# Patient Record
Sex: Female | Born: 1970 | Race: White | Hispanic: No | Marital: Married | Smoking: Never smoker
Health system: Southern US, Community
[De-identification: ages and names within clinical notes are randomized; demographics above are authoritative.]

## PROBLEM LIST (undated history)

## (undated) HISTORY — PX: VAGINAL HYSTERECTOMY: SUR661

---

## 2018-02-15 DIAGNOSIS — R946 Abnormal results of thyroid function studies: Secondary | ICD-10-CM | POA: Diagnosis not present

## 2018-02-15 DIAGNOSIS — R634 Abnormal weight loss: Secondary | ICD-10-CM | POA: Diagnosis not present

## 2018-02-15 DIAGNOSIS — E042 Nontoxic multinodular goiter: Secondary | ICD-10-CM | POA: Diagnosis not present

## 2018-02-19 DIAGNOSIS — R946 Abnormal results of thyroid function studies: Secondary | ICD-10-CM | POA: Diagnosis not present

## 2018-02-19 DIAGNOSIS — E042 Nontoxic multinodular goiter: Secondary | ICD-10-CM | POA: Diagnosis not present

## 2018-08-15 DIAGNOSIS — E042 Nontoxic multinodular goiter: Secondary | ICD-10-CM | POA: Diagnosis not present

## 2018-09-04 DIAGNOSIS — Z8249 Family history of ischemic heart disease and other diseases of the circulatory system: Secondary | ICD-10-CM | POA: Diagnosis not present

## 2018-09-04 DIAGNOSIS — Z1331 Encounter for screening for depression: Secondary | ICD-10-CM | POA: Diagnosis not present

## 2018-09-04 DIAGNOSIS — Z0000 Encounter for general adult medical examination without abnormal findings: Secondary | ICD-10-CM | POA: Diagnosis not present

## 2018-09-04 DIAGNOSIS — Z833 Family history of diabetes mellitus: Secondary | ICD-10-CM | POA: Diagnosis not present

## 2018-09-09 DIAGNOSIS — N83201 Unspecified ovarian cyst, right side: Secondary | ICD-10-CM | POA: Diagnosis not present

## 2018-09-09 DIAGNOSIS — R110 Nausea: Secondary | ICD-10-CM | POA: Diagnosis not present

## 2018-09-09 DIAGNOSIS — R109 Unspecified abdominal pain: Secondary | ICD-10-CM | POA: Diagnosis not present

## 2018-09-09 DIAGNOSIS — R319 Hematuria, unspecified: Secondary | ICD-10-CM | POA: Diagnosis not present

## 2018-09-10 DIAGNOSIS — R109 Unspecified abdominal pain: Secondary | ICD-10-CM | POA: Diagnosis not present

## 2019-09-19 DIAGNOSIS — Z20828 Contact with and (suspected) exposure to other viral communicable diseases: Secondary | ICD-10-CM | POA: Diagnosis not present

## 2019-09-22 DIAGNOSIS — U071 COVID-19: Secondary | ICD-10-CM | POA: Diagnosis not present

## 2019-09-22 DIAGNOSIS — R509 Fever, unspecified: Secondary | ICD-10-CM | POA: Diagnosis not present

## 2019-09-22 DIAGNOSIS — R519 Headache, unspecified: Secondary | ICD-10-CM | POA: Diagnosis not present

## 2019-09-26 DIAGNOSIS — R079 Chest pain, unspecified: Secondary | ICD-10-CM | POA: Diagnosis not present

## 2019-09-26 DIAGNOSIS — J1282 Pneumonia due to coronavirus disease 2019: Secondary | ICD-10-CM | POA: Diagnosis not present

## 2019-09-26 DIAGNOSIS — U071 COVID-19: Secondary | ICD-10-CM | POA: Diagnosis not present

## 2019-09-26 DIAGNOSIS — R918 Other nonspecific abnormal finding of lung field: Secondary | ICD-10-CM | POA: Diagnosis not present

## 2019-09-26 DIAGNOSIS — R0602 Shortness of breath: Secondary | ICD-10-CM | POA: Diagnosis not present

## 2019-09-26 DIAGNOSIS — R110 Nausea: Secondary | ICD-10-CM | POA: Diagnosis not present

## 2019-09-28 ENCOUNTER — Other Ambulatory Visit: Payer: Self-pay

## 2019-09-28 ENCOUNTER — Encounter (HOSPITAL_COMMUNITY): Payer: Self-pay | Admitting: *Deleted

## 2019-09-28 ENCOUNTER — Emergency Department (HOSPITAL_COMMUNITY)
Admission: EM | Admit: 2019-09-28 | Discharge: 2019-09-28 | Disposition: A | Discharge status: Home / Self Care | Payer: BC Managed Care – PPO | Source: Home / Self Care

## 2019-09-28 ENCOUNTER — Emergency Department (HOSPITAL_COMMUNITY): Payer: BC Managed Care – PPO

## 2019-09-28 ENCOUNTER — Inpatient Hospital Stay (HOSPITAL_COMMUNITY)
Admission: EM | Admit: 2019-09-28 | Discharge: 2019-10-05 | DRG: 177 | Disposition: A | Discharge status: Home / Self Care | Payer: BC Managed Care – PPO | Attending: Internal Medicine | Admitting: Internal Medicine

## 2019-09-28 DIAGNOSIS — R0689 Other abnormalities of breathing: Secondary | ICD-10-CM | POA: Diagnosis not present

## 2019-09-28 DIAGNOSIS — J9601 Acute respiratory failure with hypoxia: Secondary | ICD-10-CM | POA: Diagnosis not present

## 2019-09-28 DIAGNOSIS — Z833 Family history of diabetes mellitus: Secondary | ICD-10-CM | POA: Diagnosis not present

## 2019-09-28 DIAGNOSIS — J158 Pneumonia due to other specified bacteria: Secondary | ICD-10-CM | POA: Diagnosis not present

## 2019-09-28 DIAGNOSIS — E876 Hypokalemia: Secondary | ICD-10-CM | POA: Diagnosis not present

## 2019-09-28 DIAGNOSIS — Z8249 Family history of ischemic heart disease and other diseases of the circulatory system: Secondary | ICD-10-CM | POA: Diagnosis not present

## 2019-09-28 DIAGNOSIS — Z880 Allergy status to penicillin: Secondary | ICD-10-CM

## 2019-09-28 DIAGNOSIS — R509 Fever, unspecified: Secondary | ICD-10-CM | POA: Insufficient documentation

## 2019-09-28 DIAGNOSIS — R110 Nausea: Secondary | ICD-10-CM | POA: Insufficient documentation

## 2019-09-28 DIAGNOSIS — J1282 Pneumonia due to coronavirus disease 2019: Secondary | ICD-10-CM | POA: Diagnosis not present

## 2019-09-28 DIAGNOSIS — R9431 Abnormal electrocardiogram [ECG] [EKG]: Secondary | ICD-10-CM | POA: Diagnosis not present

## 2019-09-28 DIAGNOSIS — U071 COVID-19: Secondary | ICD-10-CM | POA: Diagnosis not present

## 2019-09-28 DIAGNOSIS — M791 Myalgia, unspecified site: Secondary | ICD-10-CM | POA: Insufficient documentation

## 2019-09-28 DIAGNOSIS — K5900 Constipation, unspecified: Secondary | ICD-10-CM | POA: Diagnosis not present

## 2019-09-28 DIAGNOSIS — R0902 Hypoxemia: Secondary | ICD-10-CM | POA: Diagnosis not present

## 2019-09-28 DIAGNOSIS — J189 Pneumonia, unspecified organism: Secondary | ICD-10-CM

## 2019-09-28 DIAGNOSIS — R079 Chest pain, unspecified: Secondary | ICD-10-CM | POA: Diagnosis not present

## 2019-09-28 DIAGNOSIS — Z5321 Procedure and treatment not carried out due to patient leaving prior to being seen by health care provider: Secondary | ICD-10-CM | POA: Insufficient documentation

## 2019-09-28 LAB — CBC WITH DIFFERENTIAL/PLATELET
Abs Immature Granulocytes: 0.03 10*3/uL (ref 0.00–0.07)
Basophils Absolute: 0 10*3/uL (ref 0.0–0.1)
Basophils Relative: 0 %
Eosinophils Absolute: 0 10*3/uL (ref 0.0–0.5)
Eosinophils Relative: 0 %
HCT: 40.2 % (ref 36.0–46.0)
Hemoglobin: 13.6 g/dL (ref 12.0–15.0)
Immature Granulocytes: 1 %
Lymphocytes Relative: 8 %
Lymphs Abs: 0.5 10*3/uL — ABNORMAL LOW (ref 0.7–4.0)
MCH: 29.7 pg (ref 26.0–34.0)
MCHC: 33.8 g/dL (ref 30.0–36.0)
MCV: 87.8 fL (ref 80.0–100.0)
Monocytes Absolute: 0.3 10*3/uL (ref 0.1–1.0)
Monocytes Relative: 4 %
Neutro Abs: 5.5 10*3/uL (ref 1.7–7.7)
Neutrophils Relative %: 87 %
Platelets: 208 10*3/uL (ref 150–400)
RBC: 4.58 MIL/uL (ref 3.87–5.11)
RDW: 12.1 % (ref 11.5–15.5)
WBC: 6.3 10*3/uL (ref 4.0–10.5)
nRBC: 0 % (ref 0.0–0.2)

## 2019-09-28 LAB — BASIC METABOLIC PANEL
Anion gap: 12 (ref 5–15)
BUN: 8 mg/dL (ref 6–20)
CO2: 28 mmol/L (ref 22–32)
Calcium: 8.7 mg/dL — ABNORMAL LOW (ref 8.9–10.3)
Chloride: 99 mmol/L (ref 98–111)
Creatinine, Ser: 0.8 mg/dL (ref 0.44–1.00)
GFR calc Af Amer: >60 mL/min (ref >60)
GFR calc non Af Amer: >60 mL/min (ref >60)
Glucose, Bld: 107 mg/dL — ABNORMAL HIGH (ref 70–99)
Potassium: 3 mmol/L — ABNORMAL LOW (ref 3.5–5.1)
Sodium: 139 mmol/L (ref 135–145)

## 2019-09-28 LAB — I-STAT BETA HCG BLOOD, ED (MC, WL, AP ONLY): I-stat hCG, quantitative: <5 m[IU]/mL (ref <5)

## 2019-09-28 LAB — TROPONIN I (HIGH SENSITIVITY)
Troponin I (High Sensitivity): 8 ng/L (ref <18)
Troponin I (High Sensitivity): 8 ng/L (ref <18)

## 2019-09-28 MED ORDER — ONDANSETRON 4 MG PO TBDP
4.0000 mg | ORAL_TABLET | Freq: Once | ORAL | Status: AC | PRN
  Administered 2019-09-28: 4 mg via ORAL
  Filled 2019-09-28: qty 1

## 2019-09-28 MED ORDER — ACETAMINOPHEN 325 MG PO TABS
650.0000 mg | ORAL_TABLET | Freq: Once | ORAL | Status: AC | PRN
  Administered 2019-09-28: 650 mg via ORAL
  Filled 2019-09-28: qty 2

## 2019-09-28 NOTE — ED Triage Notes (Signed)
Pt said she is having chest pain related to Covid.. Pt said she feels like she may have pneumonia. Pt is 12 days in of being Covid + Pt is having nausea, hight fevers, body aches.

## 2019-09-28 NOTE — ED Notes (Signed)
Pt stated that she did not want to wait and was leaving.  

## 2019-09-28 NOTE — ED Triage Notes (Signed)
covid positive for  12 days at [facility]  abd pain nausea and vomiting fever  She took tyelneol approx 1 hour ago  She arrived by [facility]  She was here last night and left without being seen

## 2019-09-28 NOTE — ED Notes (Signed)
Pts husband came in screaming and yelling at staff. Husband very rude and verbally abusive to staff. Husband stating "somone in the back called my wife a liar and no one will speak to my wife like that".

## 2019-09-29 ENCOUNTER — Encounter (HOSPITAL_COMMUNITY): Payer: Self-pay

## 2019-09-29 ENCOUNTER — Emergency Department (HOSPITAL_COMMUNITY): Payer: BC Managed Care – PPO

## 2019-09-29 DIAGNOSIS — E876 Hypokalemia: Secondary | ICD-10-CM | POA: Diagnosis present

## 2019-09-29 DIAGNOSIS — U071 COVID-19: Secondary | ICD-10-CM | POA: Diagnosis present

## 2019-09-29 DIAGNOSIS — Z880 Allergy status to penicillin: Secondary | ICD-10-CM | POA: Diagnosis not present

## 2019-09-29 DIAGNOSIS — J9601 Acute respiratory failure with hypoxia: Secondary | ICD-10-CM | POA: Insufficient documentation

## 2019-09-29 DIAGNOSIS — Z8249 Family history of ischemic heart disease and other diseases of the circulatory system: Secondary | ICD-10-CM | POA: Diagnosis not present

## 2019-09-29 DIAGNOSIS — Z833 Family history of diabetes mellitus: Secondary | ICD-10-CM | POA: Diagnosis not present

## 2019-09-29 DIAGNOSIS — K5900 Constipation, unspecified: Secondary | ICD-10-CM | POA: Diagnosis not present

## 2019-09-29 DIAGNOSIS — J189 Pneumonia, unspecified organism: Secondary | ICD-10-CM | POA: Diagnosis not present

## 2019-09-29 DIAGNOSIS — J1282 Pneumonia due to coronavirus disease 2019: Secondary | ICD-10-CM | POA: Diagnosis present

## 2019-09-29 LAB — CBC
HCT: 39.1 % (ref 36.0–46.0)
Hemoglobin: 12.8 g/dL (ref 12.0–15.0)
MCH: 28.7 pg (ref 26.0–34.0)
MCHC: 32.7 g/dL (ref 30.0–36.0)
MCV: 87.7 fL (ref 80.0–100.0)
Platelets: 206 10*3/uL (ref 150–400)
RBC: 4.46 MIL/uL (ref 3.87–5.11)
RDW: 12.3 % (ref 11.5–15.5)
WBC: 8.5 10*3/uL (ref 4.0–10.5)
nRBC: 0 % (ref 0.0–0.2)

## 2019-09-29 LAB — COMPREHENSIVE METABOLIC PANEL
ALT: 27 U/L (ref 0–44)
AST: 30 U/L (ref 15–41)
Albumin: 2.8 g/dL — ABNORMAL LOW (ref 3.5–5.0)
Alkaline Phosphatase: 34 U/L — ABNORMAL LOW (ref 38–126)
Anion gap: 12 (ref 5–15)
BUN: 7 mg/dL (ref 6–20)
CO2: 28 mmol/L (ref 22–32)
Calcium: 8 mg/dL — ABNORMAL LOW (ref 8.9–10.3)
Chloride: 100 mmol/L (ref 98–111)
Creatinine, Ser: 0.82 mg/dL (ref 0.44–1.00)
GFR calc Af Amer: >60 mL/min (ref >60)
GFR calc non Af Amer: >60 mL/min (ref >60)
Glucose, Bld: 91 mg/dL (ref 70–99)
Potassium: 2.9 mmol/L — ABNORMAL LOW (ref 3.5–5.1)
Sodium: 140 mmol/L (ref 135–145)
Total Bilirubin: 0.7 mg/dL (ref 0.3–1.2)
Total Protein: 5.9 g/dL — ABNORMAL LOW (ref 6.5–8.1)

## 2019-09-29 LAB — LACTATE DEHYDROGENASE: LDH: 328 U/L — ABNORMAL HIGH (ref 98–192)

## 2019-09-29 LAB — ABO/RH: ABO/RH(D): O NEG

## 2019-09-29 LAB — D-DIMER, QUANTITATIVE: D-Dimer, Quant: 1.58 ug/mL-FEU — ABNORMAL HIGH (ref 0.00–0.50)

## 2019-09-29 LAB — TRIGLYCERIDES: Triglycerides: 75 mg/dL (ref <150)

## 2019-09-29 LAB — FERRITIN: Ferritin: 813 ng/mL — ABNORMAL HIGH (ref 11–307)

## 2019-09-29 LAB — HIV ANTIBODY (ROUTINE TESTING W REFLEX): HIV Screen 4th Generation wRfx: NONREACTIVE

## 2019-09-29 LAB — C-REACTIVE PROTEIN: CRP: 10.5 mg/dL — ABNORMAL HIGH (ref <1.0)

## 2019-09-29 LAB — PROCALCITONIN: Procalcitonin: 0.97 ng/mL

## 2019-09-29 LAB — FIBRINOGEN: Fibrinogen: 614 mg/dL — ABNORMAL HIGH (ref 210–475)

## 2019-09-29 LAB — LACTIC ACID, PLASMA: Lactic Acid, Venous: 1.5 mmol/L (ref 0.5–1.9)

## 2019-09-29 MED ORDER — ONDANSETRON HCL 4 MG/2ML IJ SOLN: 4.0000 mg | Freq: Four times a day (QID) | INTRAMUSCULAR | Status: DC | PRN

## 2019-09-29 MED ORDER — SODIUM CHLORIDE 0.9 % IV SOLN
100.0000 mg | INTRAVENOUS | Status: AC
  Administered 2019-09-30 – 2019-10-03 (×4): 100 mg via INTRAVENOUS
  Filled 2019-09-29 (×4): qty 20

## 2019-09-29 MED ORDER — HYDROCOD POLST-CPM POLST ER 10-8 MG/5ML PO SUER
5.0000 mL | Freq: Two times a day (BID) | ORAL | Status: DC | PRN
  Administered 2019-10-01 – 2019-10-03 (×3): 5 mL via ORAL
  Filled 2019-09-29 (×3): qty 5

## 2019-09-29 MED ORDER — KETOROLAC TROMETHAMINE 15 MG/ML IJ SOLN
15.0000 mg | Freq: Once | INTRAMUSCULAR | Status: AC
  Administered 2019-09-29: 15 mg via INTRAVENOUS
  Filled 2019-09-29: qty 1

## 2019-09-29 MED ORDER — ONDANSETRON 4 MG PO TBDP
4.0000 mg | ORAL_TABLET | Freq: Once | ORAL | Status: AC | PRN
  Administered 2019-09-29: 4 mg via ORAL
  Filled 2019-09-29: qty 1

## 2019-09-29 MED ORDER — DEXAMETHASONE SODIUM PHOSPHATE 10 MG/ML IJ SOLN
10.0000 mg | Freq: Once | INTRAMUSCULAR | Status: AC
  Administered 2019-09-29: 10 mg via INTRAVENOUS
  Filled 2019-09-29: qty 1

## 2019-09-29 MED ORDER — POTASSIUM CHLORIDE CRYS ER 20 MEQ PO TBCR
40.0000 meq | EXTENDED_RELEASE_TABLET | Freq: Once | ORAL | Status: AC
  Administered 2019-09-29: 40 meq via ORAL
  Filled 2019-09-29: qty 2

## 2019-09-29 MED ORDER — ACETAMINOPHEN 325 MG PO TABS
650.0000 mg | ORAL_TABLET | Freq: Once | ORAL | Status: AC | PRN
  Administered 2019-09-29: 650 mg via ORAL
  Filled 2019-09-29: qty 2

## 2019-09-29 MED ORDER — POTASSIUM CHLORIDE 2 MEQ/ML IV SOLN
INTRAVENOUS | Status: DC
  Filled 2019-09-29 (×2): qty 1000

## 2019-09-29 MED ORDER — ENOXAPARIN SODIUM 40 MG/0.4ML ~~LOC~~ SOLN
40.0000 mg | SUBCUTANEOUS | Status: DC
  Administered 2019-09-29 – 2019-10-04 (×6): 40 mg via SUBCUTANEOUS
  Filled 2019-09-29 (×6): qty 0.4

## 2019-09-29 MED ORDER — SODIUM CHLORIDE 0.9 % IV SOLN: 200.0000 mg | Freq: Once | INTRAVENOUS | Status: DC

## 2019-09-29 MED ORDER — DEXAMETHASONE 6 MG PO TABS
6.0000 mg | ORAL_TABLET | ORAL | Status: DC
  Administered 2019-09-30 – 2019-10-05 (×6): 6 mg via ORAL
  Filled 2019-09-29: qty 2
  Filled 2019-09-29 (×2): qty 1
  Filled 2019-09-29: qty 2
  Filled 2019-09-29 (×2): qty 1

## 2019-09-29 MED ORDER — GUAIFENESIN-DM 100-10 MG/5ML PO SYRP
10.0000 mL | ORAL_SOLUTION | ORAL | Status: DC | PRN
  Administered 2019-09-30 – 2019-10-01 (×3): 10 mL via ORAL
  Filled 2019-09-29 (×3): qty 10

## 2019-09-29 MED ORDER — ACETAMINOPHEN 325 MG PO TABS
650.0000 mg | ORAL_TABLET | Freq: Once | ORAL | Status: AC
  Administered 2019-09-29: 650 mg via ORAL
  Filled 2019-09-29: qty 2

## 2019-09-29 MED ORDER — LACTATED RINGERS IV BOLUS
1000.0000 mL | Freq: Once | INTRAVENOUS | Status: AC
  Administered 2019-09-29: 1000 mL via INTRAVENOUS

## 2019-09-29 MED ORDER — SODIUM CHLORIDE 0.9 % IV SOLN
200.0000 mg | Freq: Once | INTRAVENOUS | Status: AC
  Administered 2019-09-29: 200 mg via INTRAVENOUS
  Filled 2019-09-29: qty 40

## 2019-09-29 MED ORDER — ONDANSETRON HCL 4 MG/2ML IJ SOLN
4.0000 mg | Freq: Once | INTRAMUSCULAR | Status: AC
  Administered 2019-09-29: 4 mg via INTRAVENOUS
  Filled 2019-09-29: qty 2

## 2019-09-29 MED ORDER — BARICITINIB 2 MG PO TABS
4.0000 mg | ORAL_TABLET | Freq: Every day | ORAL | Status: DC
  Administered 2019-09-29 – 2019-10-05 (×7): 4 mg via ORAL
  Filled 2019-09-29 (×7): qty 2

## 2019-09-29 MED ORDER — ONDANSETRON HCL 4 MG PO TABS: 4.0000 mg | ORAL_TABLET | Freq: Four times a day (QID) | ORAL | Status: DC | PRN

## 2019-09-29 MED ORDER — ACETAMINOPHEN 325 MG PO TABS: 650.0000 mg | ORAL_TABLET | Freq: Four times a day (QID) | ORAL | Status: DC | PRN

## 2019-09-29 MED ORDER — TRAMADOL HCL 50 MG PO TABS
50.0000 mg | ORAL_TABLET | Freq: Four times a day (QID) | ORAL | Status: DC | PRN
  Administered 2019-09-29: 50 mg via ORAL
  Filled 2019-09-29: qty 1

## 2019-09-29 MED ORDER — SODIUM CHLORIDE 0.9 % IV SOLN: 100.0000 mg | Freq: Every day | INTRAVENOUS | Status: DC

## 2019-09-29 NOTE — ED Provider Notes (Signed)
[facility] EMERGENCY DEPARTMENT Provider Note   CSN: [id number] Arrival date & time: 09/28/19  2109     History Chief Complaint  Patient presents with  . Covid +/ fever    Bailey Padilla is a 49 y.o. female.  HPI   Patient presents to the emergent department from home for COVID-19 and shortness of breath.  The patient reports she has been ill for approximately 12 to 13 days and started feeling symptoms on 8/10.  She has been seen at multiple times at urgent care.  She reports she has had decreased p.o. intake over the interim due to persistent nausea and vomiting.  She reports the Zofran ODT at home has not been helping.  She complains of generalized abdominal discomfort.  No history of lung disease.  No history of PE.  Patient has not been vaccinated.  Patient left without being seen yesterday and then returned back to the emergency department for evaluation.     History reviewed. No pertinent past medical history.  Patient Active Problem List   Diagnosis Date Noted  . Pneumonia due to COVID-19 virus 09/29/2019    Past Surgical History:  Procedure Laterality Date  . VAGINAL HYSTERECTOMY     partial      OB History   No obstetric history on file.     No family history on file.  Social History   Tobacco Use  . Smoking status: Never Smoker  . Smokeless tobacco: Never Used  Substance Use Topics  . Alcohol use: Never  . Drug use: Not on file    Home Medications Prior to Admission medications   Not on File    Allergies    Penicillins  Review of Systems   Review of Systems  Constitutional: Negative for chills and fever.  HENT: Negative for ear pain and sore throat.   Eyes: Negative for pain and visual disturbance.  Respiratory: Positive for shortness of breath. Negative for cough.   Cardiovascular: Negative for chest pain and palpitations.  Gastrointestinal: Positive for abdominal pain, diarrhea, nausea and vomiting.  Genitourinary:  Negative for dysuria and hematuria.  Musculoskeletal: Negative for arthralgias and back pain.  Skin: Negative for color change and rash.  Neurological: Negative for seizures and syncope.  All other systems reviewed and are negative.   Physical Exam Updated Vital Signs BP 118/63   Pulse 86   Temp (!) 102.3 F (39.1 C) (Oral)   Resp (!) 22   Ht 5\' 3"  (1.6 m)   Wt 70.3 kg   SpO2 95%   BMI 27.46 kg/m   Physical Exam Vitals and nursing note reviewed.  Constitutional:      General: She is not in acute distress.    Appearance: Normal appearance. She is well-developed and normal weight. She is ill-appearing. She is not toxic-appearing.  HENT:     Head: Normocephalic and atraumatic.  Eyes:     Conjunctiva/sclera: Conjunctivae normal.  Cardiovascular:     Rate and Rhythm: Regular rhythm. Tachycardia present.     Heart sounds: No murmur heard.   Pulmonary:     Effort: Pulmonary effort is normal. No respiratory distress.     Breath sounds: Rhonchi present.  Abdominal:     General: There is no distension.     Palpations: Abdomen is soft.     Tenderness: There is no abdominal tenderness.  Musculoskeletal:     Cervical back: Neck supple.     Right lower leg: No edema.  Left lower leg: No edema.  Skin:    General: Skin is warm and dry.     Capillary Refill: Capillary refill takes less than 2 seconds.  Neurological:     General: No focal deficit present.     Mental Status: She is alert and oriented to person, place, and time. Mental status is at baseline.  Psychiatric:        Mood and Affect: Mood normal.        Behavior: Behavior normal.     ED Results / Procedures / Treatments   Labs (all labs ordered are listed, but only abnormal results are displayed) Labs Reviewed  COMPREHENSIVE METABOLIC PANEL - Abnormal; Notable for the following components:      Result Value   Potassium 2.9 (*)    Calcium 8.0 (*)    Total Protein 5.9 (*)    Albumin 2.8 (*)    Alkaline  Phosphatase 34 (*)    All other components within normal limits  D-DIMER, QUANTITATIVE (NOT AT [facility]) - Abnormal; Notable for the following components:   D-Dimer, Quant 1.58 (*)    All other components within normal limits  LACTATE DEHYDROGENASE - Abnormal; Notable for the following components:   LDH 328 (*)    All other components within normal limits  FERRITIN - Abnormal; Notable for the following components:   Ferritin 813 (*)    All other components within normal limits  FIBRINOGEN - Abnormal; Notable for the following components:   Fibrinogen 614 (*)    All other components within normal limits  C-REACTIVE PROTEIN - Abnormal; Notable for the following components:   CRP 10.5 (*)    All other components within normal limits  CULTURE, BLOOD (ROUTINE X 2)  CULTURE, BLOOD (ROUTINE X 2)  CBC  LACTIC ACID, PLASMA  TRIGLYCERIDES  LACTIC ACID, PLASMA  PROCALCITONIN  HIV ANTIBODY (ROUTINE TESTING W REFLEX)  ABO/RH    EKG EKG Interpretation  Date/Time:  Sunday September 29 2019 13:35:15 EDT Ventricular Rate:  85 PR Interval:    QRS Duration: 83 QT Interval:  363 QTC Calculation: 432 R Axis:   63 Text Interpretation: Sinus rhythm Abnormal R-wave progression, early transition Confirmed by Ross Marcus ([id number]) on 09/29/2019 1:59:52 PM   Radiology DG Chest Port 1 View  Result Date: 09/29/2019 CLINICAL DATA:  Fever and COVID. EXAM: PORTABLE CHEST 1 VIEW COMPARISON:  09/28/2019 FINDINGS: The cardiomediastinal silhouette is unremarkable. Increasing bilateral airspace opacities within the mid and lower lungs noted. No pleural effusion or pneumothorax identified. IMPRESSION: Increasing bilateral mid and lower lung airspace opacities compatible with infection/pneumonia. Electronically Signed   By: Harmon Pier M.D.   On: 09/29/2019 13:23   DG Chest Portable 1 View  Result Date: 09/28/2019 CLINICAL DATA:  49 year old female with history of chest pain. History of COVID infection. EXAM:  PORTABLE CHEST 1 VIEW COMPARISON:  No priors. FINDINGS: Lung volumes are low. Patchy ill-defined opacities are noted throughout the mid to lower lungs bilaterally. No pleural effusions. No pneumothorax. No definite suspicious appearing pulmonary nodules or masses are noted. No evidence of pulmonary edema. Heart size is normal. Upper mediastinal contours are within normal limits. IMPRESSION: 1. The appearance the chest is compatible with multilobar bilateral pneumonia from COVID infection, as above. Electronically Signed   By: Trudie Reed M.D.   On: 09/28/2019 04:21    Procedures Procedures (including critical care time)  Medications Ordered in ED Medications  remdesivir 200 mg in sodium chloride 0.9% 250 mL IVPB (200  mg Intravenous New Bag/Given 09/29/19 1411)    Followed by  remdesivir 100 mg in sodium chloride 0.9 % 100 mL IVPB (has no administration in time range)  enoxaparin (LOVENOX) injection 40 mg (has no administration in time range)  lactated ringers 1,000 mL with potassium chloride 20 mEq infusion (has no administration in time range)  dexamethasone (DECADRON) tablet 6 mg (has no administration in time range)  guaiFENesin-dextromethorphan (ROBITUSSIN DM) 100-10 MG/5ML syrup 10 mL (has no administration in time range)  chlorpheniramine-HYDROcodone (TUSSIONEX) 10-8 MG/5ML suspension 5 mL (has no administration in time range)  acetaminophen (TYLENOL) tablet 650 mg (has no administration in time range)  ondansetron (ZOFRAN) tablet 4 mg (has no administration in time range)    Or  ondansetron (ZOFRAN) injection 4 mg (has no administration in time range)  traMADol (ULTRAM) tablet 50 mg (has no administration in time range)  acetaminophen (TYLENOL) tablet 650 mg (650 mg Oral Given 09/29/19 0254)  ondansetron (ZOFRAN-ODT) disintegrating tablet 4 mg (4 mg Oral Given 09/29/19 0254)  ondansetron (ZOFRAN) injection 4 mg (4 mg Intravenous Given 09/29/19 1222)  lactated ringers bolus 1,000 mL  (1,000 mLs Intravenous New Bag/Given 09/29/19 1222)  dexamethasone (DECADRON) injection 10 mg (10 mg Intravenous Given 09/29/19 1300)  ketorolac (TORADOL) 15 MG/ML injection 15 mg (15 mg Intravenous Given 09/29/19 1300)  acetaminophen (TYLENOL) tablet 650 mg (650 mg Oral Given 09/29/19 1409)  potassium chloride SA (KLOR-CON) CR tablet 40 mEq (40 mEq Oral Given 09/29/19 1410)    ED Course   Karine Garn is a 49 y.o. female with PMHx listed that presents to the Emergency Department complaint of Covid +/ fever  ED Course: Initial exam completed.   Ill-appearing but hemodynamically stable.  Nontoxic and afebrile.  Physical exam significant for age-appropriate 49 year old female with bilateral rhonchi on auscultation, abdomen soft, nondistended, nonfocally tender, extremities perfused, and no lower extremity edema noted.  Initial differential includes viral pneumonia, secondary bacterial pneumonia, dehydration, electrolyte abnormalities, and respiratory failure.  Patient noted to be hypoxic requiring nasal cannula with saturation approximately 87 to 88%.  2 L nasal cannula improved her oxygenation.  Labs including standard COVID-19 labs ordered.  CMP with hypokalemia 2.9 otherwise no acute electrolyte arise requiring urgent intervention.  CBC without evidence of leukocytosis or leukopenia and stable hemoglobin.  CXR with increasing bilateral mid and lower lung airspace opacities consistent with infection/pneumonia.  Given the patient's hypoxia, she will require inpatient management for further treatment.  Remdesivir and Decadron ordered.  Discussed with medicine who agreed admit the patient for further management.   Diagnostics Vital Signs: reviewed Labs: reviewed and significant findings discussed above Imaging: personally reviewed images interpreted by radiology EKG: reviewed Records: nursing notes along with previous records reviewed and pertinent data discussed   Consults:  Medicine     Reevaluation/Disposition:  Upon reevaluation, patients symptoms stable.    All questions answered.  Patient and/or family was understanding and in agreement with today's assessment and plan.   Campbell Riches, MD Emergency Medicine, PGY-3   Note: Dragon medical dictation software was used in the creation of this note.   Final Clinical Impression(s) / ED Diagnoses Final diagnoses:  Acute respiratory failure with hypoxia (HCC)  Multifocal pneumonia  Hypokalemia    Rx / DC Orders ED Discharge Orders    None       Nino Parsley, MD 09/29/19 1610    Shon Baton, MD 09/29/19 1531

## 2019-09-29 NOTE — ED Notes (Signed)
Triage rn notified of temp 

## 2019-09-29 NOTE — ED Provider Notes (Signed)
  I saw and evaluated the patient, reviewed the resident's note and I agree with the findings and plan.  49 year old female unvaccinated from COVID-19 who presents with nausea, vomiting, shortness of breath.  Has been seen and evaluated multiple times.  Tested positive for COVID-19 on August 12.  Symptoms started on August 10.  She has had progressively worsening symptoms.  Reports ongoing nausea, vomiting, and shortness of breath.  Reports taking outpatient medications including Zofran, azithromycin, and steroids with no relief.   EKG: EKG Interpretation  Date/Time:  Sunday September 29 2019 13:35:15 EDT Ventricular Rate:  85 PR Interval:    QRS Duration: 83 QT Interval:  363 QTC Calculation: 432 R Axis:   63 Text Interpretation: Sinus rhythm Abnormal R-wave progression, early transition Confirmed by Ross Marcus ([id number]) on 09/29/2019 1:59:52 PM    Physical Exam  BP 126/71   Pulse 86   Temp (!) 102.3 F (39.1 C) (Oral)   Resp (!) 21   Ht 1.6 m (5\' 3" )   Wt 70.3 kg   SpO2 95%   BMI 27.46 kg/m   Physical Exam Ill-appearing but nontoxic Mildly tachypneic, O2 sats 87 to 90% Dry and dehydrated appearing ED Course/Procedures     Procedures  CRITICAL CARE Performed by:   Total critical care time:40 minutes  Critical care time was exclusive of separately billable procedures and treating other patients.  Critical care was necessary to treat or prevent imminent or life-threatening deterioration.  Critical care was time spent personally by me on the following activities: development of treatment plan with patient and/or surrogate as well as nursing, discussions with consultants, evaluation of patient's response to treatment, examination of patient, obtaining history from patient or surrogate, ordering and performing treatments and interventions, ordering and review of laboratory studies, ordering and review of radiographic studies, pulse oximetry and  re-evaluation of patient's condition.   MDM    Patient with ongoing COVID-19 symptoms.  Marginal O2 sats 87 to 92% without exertion.  She was placed on supplemental oxygen.  Will send preadmission Covid labs.  Chest x-ray reviewed by myself and shows progression of likely Covid pneumonia.  Patient will need admission given hypoxia.       Shon Baton, MD 09/29/19 [phone]

## 2019-09-29 NOTE — ED Notes (Signed)
Pt updated. Skin w/d, resp e/u. Face appears flushed.

## 2019-09-29 NOTE — H&P (Signed)
Date: 09/29/2019               Patient Name:  Jashay Roddy MRN: [id number]  DOB: Mar 31, 1970 Age / Sex: 49 y.o., female   PCP: Patient, No Pcp Per         Medical Service: Internal Medicine Teaching Service         Attending Physician: Dr. Inez Catalina, MD    First Contact: Dr. Claudette Laws Pager: [phone]  Second Contact: Dr. Chesley Mires Pager: [phone]       After Hours (After 5p/  First Contact Pager: [phone]  weekends / holidays): Second Contact Pager: [phone]   Chief Complaint: fever, malaise, shortness of breath  History of Present Illness: Ms. Daubert is a 49 y/o otherwise healthy female who presents with approximately 2 week history of intermittent fevers, malaise, body aches, cough and shortness of breath in the setting of testing positive for COVID on 09/19/19. She also endorses poor appetite, persistent nausea, and intermittent vomiting. Fever tends to spike up to 103 at night. She has been having sharp chest pain that is worse with coughing and deep breaths. She has not been able to get out of bed much, other than when her husband encourages her to walk 15 minutes per day. Body aches are fairly intense, making it hard to sleep. She initially had a sore throat, but this has resolved. Cough is productive of white mucous.  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 They did not get vaccinated. Her husband was sick as well, but got over his symptoms fairly quickly. They are fairly certain they contracted the virus from a church member who was also hospitalized with COVID-19.  She denies syncope, numbness/tingling, abdominal pain, loose stools, or urinary symptoms.   Meds: She is not on any medications as an outpatient   Allergies: Allergies as of 09/28/2019 - Review Complete 09/28/2019  Allergen Reaction Noted  . Penicillins  09/28/2019   History reviewed. No pertinent past medical history.  Family History: Mom and Dad both have HTN; mom has diabetes   Social  History: works as a Education officer, environmental, lives at home with her husband. No tobacco, EtOH or illicit drug use  Review of Systems: A complete ROS was negative except as per HPI.   Physical Exam: Blood pressure 110/69, pulse (!) 59, temperature 98.6 F (37 C), temperature source Oral, resp. rate (!) 23, height 5\' 3"  (1.6 m), weight 70.3 kg, SpO2 95 %. General: ill and tired-appearing HEENT: dry mucous membranes, throat is mildly erythematous  CV: RRR; no m/r/g Pulm: slightly tachypneic; desats with any basic movement, requiring 4L [employer]; lungs CTAB Abd: BS+; abdomen soft, non-tender, non-distended  Ext: No edema Skin: flushed, hot to touch Neuro: A&Ox4; no focal deficits   EKG: personally reviewed my interpretation is NSR; no ischemic changes  CXR: personally reviewed my interpretation is bilateral infiltrates; no effusions   Assessment & Plan by Problem: Active Problems:   Pneumonia due to COVID-19 virus  Ms. Alsobrook is a 49 y/o otherwise healthy female who presents with 2 weeks of malaise, fevers, cough, shortness of breath that has progressed since COVID-19 diagnosis on 09/19/19.   COVID-19 pneumonia -currently saturating on 4L [place] -started on remdesivir and decadron -due to CRP >10, oxygen requirement and heavy symptom burden will also start baricitinib -supportive care with IVF, antipyretics, anti-emetics -Robitussin DM, tussionex for cough -tramadol as needed for severe body aches  -flutter valve, IS   DVT ppx: Lovenox Diet:  Regular IVF: LR at 125 cc/hr CODE:FULL   Dispo: Admit patient to Inpatient with expected length of stay greater than 2 midnights.  SignedBridget Hartshorn, DO 09/29/2019, 6:41 PM  Pager: [phone] After 5pm on weekdays and 1pm on weekends: On Call pager: [phone]

## 2019-09-30 DIAGNOSIS — J188 Other pneumonia, unspecified organism: Secondary | ICD-10-CM | POA: Insufficient documentation

## 2019-09-30 DIAGNOSIS — J189 Pneumonia, unspecified organism: Secondary | ICD-10-CM | POA: Insufficient documentation

## 2019-09-30 LAB — COMPREHENSIVE METABOLIC PANEL
ALT: 27 U/L (ref 0–44)
AST: 30 U/L (ref 15–41)
Albumin: 2.4 g/dL — ABNORMAL LOW (ref 3.5–5.0)
Alkaline Phosphatase: 32 U/L — ABNORMAL LOW (ref 38–126)
Anion gap: 13 (ref 5–15)
BUN: 10 mg/dL (ref 6–20)
CO2: 22 mmol/L (ref 22–32)
Calcium: 7.9 mg/dL — ABNORMAL LOW (ref 8.9–10.3)
Chloride: 105 mmol/L (ref 98–111)
Creatinine, Ser: 0.63 mg/dL (ref 0.44–1.00)
GFR calc Af Amer: >60 mL/min (ref >60)
GFR calc non Af Amer: >60 mL/min (ref >60)
Glucose, Bld: 100 mg/dL — ABNORMAL HIGH (ref 70–99)
Potassium: 3.8 mmol/L (ref 3.5–5.1)
Sodium: 140 mmol/L (ref 135–145)
Total Bilirubin: 0.4 mg/dL (ref 0.3–1.2)
Total Protein: 5.6 g/dL — ABNORMAL LOW (ref 6.5–8.1)

## 2019-09-30 LAB — CBC WITH DIFFERENTIAL/PLATELET
Abs Immature Granulocytes: 0.04 10*3/uL (ref 0.00–0.07)
Basophils Absolute: 0 10*3/uL (ref 0.0–0.1)
Basophils Relative: 0 %
Eosinophils Absolute: 0 10*3/uL (ref 0.0–0.5)
Eosinophils Relative: 0 %
HCT: 38.1 % (ref 36.0–46.0)
Hemoglobin: 12.6 g/dL (ref 12.0–15.0)
Immature Granulocytes: 1 %
Lymphocytes Relative: 7 %
Lymphs Abs: 0.5 10*3/uL — ABNORMAL LOW (ref 0.7–4.0)
MCH: 29.4 pg (ref 26.0–34.0)
MCHC: 33.1 g/dL (ref 30.0–36.0)
MCV: 88.8 fL (ref 80.0–100.0)
Monocytes Absolute: 0.2 10*3/uL (ref 0.1–1.0)
Monocytes Relative: 3 %
Neutro Abs: 6.7 10*3/uL (ref 1.7–7.7)
Neutrophils Relative %: 89 %
Platelets: 220 10*3/uL (ref 150–400)
RBC: 4.29 MIL/uL (ref 3.87–5.11)
RDW: 12 % (ref 11.5–15.5)
WBC: 7.4 10*3/uL (ref 4.0–10.5)
nRBC: 0 % (ref 0.0–0.2)

## 2019-09-30 LAB — FERRITIN: Ferritin: 795 ng/mL — ABNORMAL HIGH (ref 11–307)

## 2019-09-30 LAB — C-REACTIVE PROTEIN: CRP: 12.9 mg/dL — ABNORMAL HIGH (ref <1.0)

## 2019-09-30 NOTE — ED Notes (Signed)
Pt transferred to hospital bed for comfort.

## 2019-09-30 NOTE — ED Notes (Signed)
Patients gown has been changed linen changed .assit  Pt.on bedpan

## 2019-09-30 NOTE — ED Notes (Addendum)
Pt's sats 89-90% 2L [place]. RN titrated to 5 L [place]. Sats 93% currently. Pt reports she feels like she cant get a deep breath. MD paged

## 2019-09-30 NOTE — ED Notes (Signed)
Ordered breakfast--Bailey Padilla 

## 2019-09-30 NOTE — ED Notes (Signed)
MD at bedside to assess.

## 2019-09-30 NOTE — ED Notes (Signed)
Pt increased to 6 L oxygen d/t sustained spo2 decrease to 88% while sleeping

## 2019-09-30 NOTE — ED Notes (Signed)
Provided patient with incentive spirometer.   Assisted patient on and off bed pan. And brief changed. No complaints at this time.

## 2019-09-30 NOTE — Discharge Summary (Signed)
Name: Bailey Padilla MRN: [id number] DOB: [birth year] 49 y.o. PCP: Patient, No Pcp Per  Date of Admission: 09/28/2019  9:20 PM Date of Discharge: 10/05/19 Attending Physician: Inez Catalina, MD  Discharge Diagnosis:  1. Pneumonia secondary to COVID-19   Discharge Medications: Allergies as of 10/05/2019      Reactions   Penicillins Rash      Medication List    STOP taking these medications   azithromycin 250 MG tablet Commonly known as: ZITHROMAX   predniSONE 20 MG tablet Commonly known as: DELTASONE     TAKE these medications   acetaminophen 500 MG tablet Commonly known as: TYLENOL Take 1,000 mg by mouth every 6 (six) hours as needed for fever.   acetaminophen-codeine 300-30 MG tablet Commonly known as: TYLENOL #3 Take 1 tablet by mouth every 4 (four) hours as needed for moderate pain.   Airborne [employer] Chew 1-2 tablets by mouth daily.   ascorbic acid 500 MG tablet Commonly known as: VITAMIN C Take 500-1,000 mg by mouth daily.   aspirin EC 81 MG tablet Take 81 mg by mouth daily. Swallow whole.   benzonatate 200 MG capsule Commonly known as: TESSALON Take 200 mg by mouth 3 (three) times daily as needed for cough.   dexamethasone 6 MG tablet Commonly known as: DECADRON Take 1 tablet (6 mg total) by mouth daily for 3 days. Start taking on: October 06, 2019   ondansetron 4 MG tablet Commonly known as: ZOFRAN Take 4-8 mg by mouth every 8 (eight) hours as needed for nausea/vomiting.   VITAMIN D-3 PO Take 1 capsule by mouth daily.   zinc gluconate 50 MG tablet Take 50 mg by mouth daily.            Durable Medical Equipment  (From admission, onward)         Start     Ordered   10/04/19 1442  For home use only DME oxygen  Once       Question Answer Comment  Length of Need 6 Months   Mode or (Route) Nasal cannula   Liters per Minute 2   Frequency Continuous (stationary and portable oxygen unit needed)   Oxygen conserving device Yes     Oxygen delivery system Gas      10/04/19 1442          Disposition and follow-up:   Ms.Bailey Padilla was discharged from [facility] in Stable condition.  At the hospital follow up visit please address:  1.    - Evaluate need for continued home oxygen  2.  Labs / imaging needed at time of follow-up:   none  3.  Pending labs/ test needing follow-up:   none  Follow-up Appointments:  Follow-up Information    [facility] at [facility] Follow up.   Specialty: Family Medicine Why: Call to schedule an appointment with the COVID clinic on Monday when the office opens. They will also help you establish a PCP. You can also call the number on your insurance card for assistance with establishing a PCP. Contact information: [address] [place] [address] [phone]              Hospital Course by problem list:  Ms. Bailey Padilla is a 49 year old otherwise healthy woman who presented on 09/29/19 with 2 weeks of malaise, fevers, cough, shortness of breath that had progressed since COVID-19 diagnosis on 09/19/19.   COVID-19 pneumonia Presented on 09/29/19 with nausea, vomiting, and worsening shortness  of breath after testing positive for COVID-19 on August 12. Symptoms started on August 10. Treated here with 5 days of Remdesivir. Also treated with decadron and baricitinib for the duration of her hospitalization. She was treated with supplemental oxygen, requiring 2L with exertion at the time of discharge. She was also discharged home with a prescription for 3 days of Decadron to complete her 10-day course.   Discharge Vitals:   BP 107/62 (BP Location: Right Arm)   Pulse 62   Temp 97.9 F (36.6 C) (Oral)   Resp (!) 21   Ht 5\' 3"  (1.6 m)   Wt 70.3 kg   SpO2 91%   BMI 27.46 kg/m   Pertinent Labs, Studies, and Procedures:   09/29/19 PORTABLE CHEST 1 VIEW  COMPARISON:  09/28/2019  FINDINGS: The cardiomediastinal silhouette is  unremarkable.  Increasing bilateral airspace opacities within the mid and lower lungs noted.  No pleural effusion or pneumothorax identified.  IMPRESSION: Increasing bilateral mid and lower lung airspace opacities compatible with infection/pneumonia.  09/28/19 PORTABLE CHEST 1 VIEW  COMPARISON:  No priors.  FINDINGS: Lung volumes are low. Patchy ill-defined opacities are noted throughout the mid to lower lungs bilaterally. No pleural effusions. No pneumothorax. No definite suspicious appearing pulmonary nodules or masses are noted. No evidence of pulmonary edema. Heart size is normal. Upper mediastinal contours are within normal limits.  IMPRESSION: 1. The appearance the chest is compatible with multilobar bilateral pneumonia from COVID infection, as above.  Discharge Instructions: Discharge Instructions    Discharge instructions   Complete by: As directed    Ms. Bailey Padilla, it was a pleasure taking care of you. You were treated for COVID-19 pneumonia. You received Remdesivir, baricitinib, steroids, and supplemental oxygen. We have prescribed 3 additional days of steroid, which you will start tomorrow, 8/29. We are also discharging you with home oxygen to use while walking or otherwise exerting yourself. You should continue your quarantine until 21 days after the onset of your symptoms, which is ~10/10/19. After that time, you are also eligible to receive a COVID vaccine, which we highly recommend. We recommend that you call your primary care doctor in the next week or so to schedule a hospital follow-up appointment.   Take care!      Signed: 12/10/19, MD 10/05/2019, 12:51 PM   Pager: [phone]

## 2019-09-30 NOTE — ED Notes (Signed)
Pt alert and oriented. No pain. Speaking in full clear sentences.

## 2019-09-30 NOTE — Progress Notes (Signed)
   Subjective:   No acute events overnight.  Bailey Padilla reports she is "doing alright, all things considered." Notes that she is still short of breath and coughing, but reports her chest pain with coughing is improved. Denies abdominal pain, diarrhea, urinary complaints.   Objective:  Vital signs in last 24 hours: Vitals:   09/30/19 0715 09/30/19 0730 09/30/19 0929 09/30/19 1504  BP: 114/66  115/61 (!) 110/59  Pulse: (!) 53 68 (!) 57 67  Resp: (!) 27 (!) 25 (!) 24   Temp:   98.4 F (36.9 C)   TempSrc:   Oral   SpO2: 95% 95% 95% 95%  Weight:      Height:       Constitutional: Tired- and anxious-appearing woman sitting upright in bed Cardiovascular: Regular rate and rhythm, no m/r/g Pulmonary: dyspneic with speaking, requiring 4L, lungs slightly rhonchorous throughout  Assessment/Plan:  Active Problems:   Pneumonia due to COVID-19 virus  Bailey Padilla is a 49 y/o otherwise healthy female who presents with 2 weeks of malaise, fevers, cough, shortness of breath that has progressed since COVID-19 diagnosis on 09/19/19.   This is hospital day 1.   COVID-19 pneumonia - Maintained saturations 92-97% on 2L [place] overnight, however now requiring 4L [place] to maintain saturations above 93% - Remdesivir and decadron day 2 - Baricitinib day 2 due to elevated inflammatory markers, O2 requirement, and heavy symptom burden.  -supportive care with IVF, antipyretics, anti-emetics -Robitussin DM, tussionex for cough -tramadol as needed for severe body aches  -flutter valve, IS   Hypokalemia - 2.9 yesterday, improved to 3.8 today after IVF w/ KCl - Will d/c IVF since patient appears euvolemic with low threshold to resume if PO intake does not improve  Prior to Admission Living Arrangement: home Anticipated Discharge Location: home Barriers to Discharge:  Dispo: Anticipated discharge in approximately 1-2 day(s).   Alphonzo Severance, MD 09/30/2019, 4:33 PM Pager: [phone] After 5pm on weekdays and  1pm on weekends: On Call pager [phone]

## 2019-09-30 NOTE — ED Notes (Signed)
This RN provided education on lying prone and using incentive spirometer. Patient verbalized understanding and will attempt to prone tonight.

## 2019-10-01 LAB — D-DIMER, QUANTITATIVE: D-Dimer, Quant: 1.07 ug/mL-FEU — ABNORMAL HIGH (ref 0.00–0.50)

## 2019-10-01 LAB — CBC WITH DIFFERENTIAL/PLATELET
Abs Immature Granulocytes: 0.08 10*3/uL — ABNORMAL HIGH (ref 0.00–0.07)
Basophils Absolute: 0 10*3/uL (ref 0.0–0.1)
Basophils Relative: 0 %
Eosinophils Absolute: 0 10*3/uL (ref 0.0–0.5)
Eosinophils Relative: 0 %
HCT: 40.8 % (ref 36.0–46.0)
Hemoglobin: 13.8 g/dL (ref 12.0–15.0)
Immature Granulocytes: 1 %
Lymphocytes Relative: 10 %
Lymphs Abs: 0.7 10*3/uL (ref 0.7–4.0)
MCH: 30.1 pg (ref 26.0–34.0)
MCHC: 33.8 g/dL (ref 30.0–36.0)
MCV: 88.9 fL (ref 80.0–100.0)
Monocytes Absolute: 0.4 10*3/uL (ref 0.1–1.0)
Monocytes Relative: 5 %
Neutro Abs: 5.9 10*3/uL (ref 1.7–7.7)
Neutrophils Relative %: 84 %
Platelets: 277 10*3/uL (ref 150–400)
RBC: 4.59 MIL/uL (ref 3.87–5.11)
RDW: 11.9 % (ref 11.5–15.5)
WBC: 7 10*3/uL (ref 4.0–10.5)
nRBC: 0 % (ref 0.0–0.2)

## 2019-10-01 LAB — COMPREHENSIVE METABOLIC PANEL
ALT: 35 U/L (ref 0–44)
AST: 33 U/L (ref 15–41)
Albumin: 2.5 g/dL — ABNORMAL LOW (ref 3.5–5.0)
Alkaline Phosphatase: 36 U/L — ABNORMAL LOW (ref 38–126)
Anion gap: 10 (ref 5–15)
BUN: 11 mg/dL (ref 6–20)
CO2: 28 mmol/L (ref 22–32)
Calcium: 8.4 mg/dL — ABNORMAL LOW (ref 8.9–10.3)
Chloride: 104 mmol/L (ref 98–111)
Creatinine, Ser: 0.65 mg/dL (ref 0.44–1.00)
GFR calc Af Amer: >60 mL/min (ref >60)
GFR calc non Af Amer: >60 mL/min (ref >60)
Glucose, Bld: 113 mg/dL — ABNORMAL HIGH (ref 70–99)
Potassium: 3.8 mmol/L (ref 3.5–5.1)
Sodium: 142 mmol/L (ref 135–145)
Total Bilirubin: 0.6 mg/dL (ref 0.3–1.2)
Total Protein: 5.8 g/dL — ABNORMAL LOW (ref 6.5–8.1)

## 2019-10-01 LAB — C-REACTIVE PROTEIN: CRP: 6.4 mg/dL — ABNORMAL HIGH (ref <1.0)

## 2019-10-01 LAB — FERRITIN: Ferritin: 1280 ng/mL — ABNORMAL HIGH (ref 11–307)

## 2019-10-01 NOTE — ED Notes (Signed)
New purwick and suction canister provided for pt

## 2019-10-01 NOTE — Progress Notes (Signed)
Patient admitted to 62w23. Alert and oriented x4. On 6 L [place]. Educated and oriented to floor and call bell. Will continue to monitor throughout the night.

## 2019-10-01 NOTE — Progress Notes (Signed)
   Subjective: No acute events overnight. Husband was on speaker phone during today's visit. Ms. Mcelwain continues to feel quite ill from this infection. She becomes dyspneic with any type of movement and has a difficult time taking deeper breaths. Still has productive cough with associated chest pain. She has been using her incentive spirometer every hour. She and her husband had several questions which were all addressed.   Objective:  Vital signs in last 24 hours: Vitals:   10/01/19 0801 10/01/19 0900 10/01/19 1000 10/01/19 1100  BP:  110/66 114/74 124/74  Pulse:  71 (!) 58 (!) 52  Resp:  14 (!) 28 13  Temp: 98.3 F (36.8 C)     TempSrc: Oral     SpO2: 100% 91% 95% 97%  Weight:      Height:       General: ill-appearing Pulm: dyspneic with conversations; she exhibits shallow breathing but lungs overall sounds clear   Assessment/Plan:  Active Problems:   Pneumonia due to COVID-19 virus  Ms. Osment is a 49 y/o otherwise healthy female who presents with 2 weeks of malaise, fevers, cough, shortness of breath that has progressed since COVID-19 diagnosis on 09/19/19.   This is hospital day 2.   COVID-19 pneumonia - she continues to have heavy symptom burden and is requiring 5-6L oxygen supplementation - this is day 3 of decadron, remdesivir, and barictinib - CRP is downtrending  - supportive care with antipyretics, anti-emetics - Robitussin DM, tussionex for cough - tramadol as needed for severe body aches  - flutter valve, IS - self-proning as tolerated   Prior to Admission Living Arrangement: home Anticipated Discharge Location: home Barriers to Discharge: continued medical improvement.   Lenward Chancellor D, DO 10/01/2019, 4:53 PM Pager: [phone] After 5pm on weekdays and 1pm on weekends: On Call pager [phone]

## 2019-10-02 LAB — COMPREHENSIVE METABOLIC PANEL
ALT: 54 U/L — ABNORMAL HIGH (ref 0–44)
AST: 43 U/L — ABNORMAL HIGH (ref 15–41)
Albumin: 2.9 g/dL — ABNORMAL LOW (ref 3.5–5.0)
Alkaline Phosphatase: 37 U/L — ABNORMAL LOW (ref 38–126)
Anion gap: 12 (ref 5–15)
BUN: 12 mg/dL (ref 6–20)
CO2: 26 mmol/L (ref 22–32)
Calcium: 8.4 mg/dL — ABNORMAL LOW (ref 8.9–10.3)
Chloride: 102 mmol/L (ref 98–111)
Creatinine, Ser: 0.63 mg/dL (ref 0.44–1.00)
GFR calc Af Amer: >60 mL/min (ref >60)
GFR calc non Af Amer: >60 mL/min (ref >60)
Glucose, Bld: 88 mg/dL (ref 70–99)
Potassium: 3.5 mmol/L (ref 3.5–5.1)
Sodium: 140 mmol/L (ref 135–145)
Total Bilirubin: 0.8 mg/dL (ref 0.3–1.2)
Total Protein: 6.3 g/dL — ABNORMAL LOW (ref 6.5–8.1)

## 2019-10-02 LAB — CBC WITH DIFFERENTIAL/PLATELET
Abs Immature Granulocytes: 0.08 10*3/uL — ABNORMAL HIGH (ref 0.00–0.07)
Basophils Absolute: 0 10*3/uL (ref 0.0–0.1)
Basophils Relative: 0 %
Eosinophils Absolute: 0 10*3/uL (ref 0.0–0.5)
Eosinophils Relative: 0 %
HCT: 43.2 % (ref 36.0–46.0)
Hemoglobin: 14.2 g/dL (ref 12.0–15.0)
Immature Granulocytes: 1 %
Lymphocytes Relative: 12 %
Lymphs Abs: 0.9 10*3/uL (ref 0.7–4.0)
MCH: 29.2 pg (ref 26.0–34.0)
MCHC: 32.9 g/dL (ref 30.0–36.0)
MCV: 88.9 fL (ref 80.0–100.0)
Monocytes Absolute: 0.5 10*3/uL (ref 0.1–1.0)
Monocytes Relative: 7 %
Neutro Abs: 5.6 10*3/uL (ref 1.7–7.7)
Neutrophils Relative %: 80 %
Platelets: 250 10*3/uL (ref 150–400)
RBC: 4.86 MIL/uL (ref 3.87–5.11)
RDW: 11.7 % (ref 11.5–15.5)
WBC: 7.1 10*3/uL (ref 4.0–10.5)
nRBC: 0 % (ref 0.0–0.2)

## 2019-10-02 LAB — C-REACTIVE PROTEIN: CRP: 2.5 mg/dL — ABNORMAL HIGH (ref <1.0)

## 2019-10-02 LAB — FERRITIN: Ferritin: 1057 ng/mL — ABNORMAL HIGH (ref 11–307)

## 2019-10-02 LAB — D-DIMER, QUANTITATIVE: D-Dimer, Quant: 0.62 ug/mL-FEU — ABNORMAL HIGH (ref 0.00–0.50)

## 2019-10-02 MED ORDER — SENNOSIDES-DOCUSATE SODIUM 8.6-50 MG PO TABS
2.0000 | ORAL_TABLET | Freq: Two times a day (BID) | ORAL | Status: DC
  Administered 2019-10-02 – 2019-10-05 (×6): 2 via ORAL
  Filled 2019-10-02 (×6): qty 2

## 2019-10-02 NOTE — Progress Notes (Signed)
   Subjective: Bailey Padilla was sitting in bedside chair enjoying the sunshine by the window when we saw her this afternoon. She looked and felt better compared to the last two days. Her body aches have significantly improved, she feels like she is able to take deeper breaths, and her appetite has improved. She continues to desaturate with extensive conversation or movement.   Objective:  Vital signs in last 24 hours: Vitals:   10/02/19 0021 10/02/19 0028 10/02/19 0516 10/02/19 1440  BP: (!) 96/56 112/66 114/75 99/68  Pulse:  (!) 42 (!) 46 (!) 59  Resp: 20 19 18 19   Temp: 98 F (36.7 C) 98 F (36.7 C) 98.1 F (36.7 C) 98.2 F (36.8 C)  TempSrc: Oral Oral Oral Oral  SpO2: 98% 97% 96% 95%  Weight:      Height:       General: awake, alert, sitting in bedside chair in NAD Pulm: saturating on 5L, briefly desaturates with movement or extensive conversation   Assessment/Plan:  Active Problems:   Pneumonia due to COVID-19 virus  Bailey Padilla is a 49 y/o otherwise healthy female who presents with 2 weeks of malaise, fevers, cough, shortness of breath that has progressed since COVID-19 diagnosis on 09/19/19.  This is hospital day 3.  COVID-19 pneumonia - looks and feels improved today - still desats with lengthy conversation or basic movement; currently on 5L [place] - this is day 4 of decadron, remdesivir, and barictinib - inflammatory markers are downtrending; will hopefully be able to wean oxygen in the next day or 2 - supportive care with antipyretics, anti-emetics - Robitussin DM, tussionex for cough  - flutter valve, IS - self-proning as tolerated   Dispo: Anticipated discharge pending further improvement in respiratory status   11/19/19, DO 10/02/2019, 3:09 PM Pager: [phone] After 5pm on weekdays and 1pm on weekends: On Call pager [phone]

## 2019-10-03 LAB — CBC WITH DIFFERENTIAL/PLATELET
Abs Immature Granulocytes: 0.12 10*3/uL — ABNORMAL HIGH (ref 0.00–0.07)
Basophils Absolute: 0 10*3/uL (ref 0.0–0.1)
Basophils Relative: 0 %
Eosinophils Absolute: 0 10*3/uL (ref 0.0–0.5)
Eosinophils Relative: 0 %
HCT: 37.6 % (ref 36.0–46.0)
Hemoglobin: 12.8 g/dL (ref 12.0–15.0)
Immature Granulocytes: 2 %
Lymphocytes Relative: 12 %
Lymphs Abs: 0.8 10*3/uL (ref 0.7–4.0)
MCH: 29.7 pg (ref 26.0–34.0)
MCHC: 34 g/dL (ref 30.0–36.0)
MCV: 87.2 fL (ref 80.0–100.0)
Monocytes Absolute: 0.4 10*3/uL (ref 0.1–1.0)
Monocytes Relative: 7 %
Neutro Abs: 5.2 10*3/uL (ref 1.7–7.7)
Neutrophils Relative %: 79 %
Platelets: 325 10*3/uL (ref 150–400)
RBC: 4.31 MIL/uL (ref 3.87–5.11)
RDW: 11.7 % (ref 11.5–15.5)
WBC: 6.5 10*3/uL (ref 4.0–10.5)
nRBC: 0 % (ref 0.0–0.2)

## 2019-10-03 LAB — COMPREHENSIVE METABOLIC PANEL
ALT: 52 U/L — ABNORMAL HIGH (ref 0–44)
AST: 36 U/L (ref 15–41)
Albumin: 2.4 g/dL — ABNORMAL LOW (ref 3.5–5.0)
Alkaline Phosphatase: 32 U/L — ABNORMAL LOW (ref 38–126)
Anion gap: 10 (ref 5–15)
BUN: 12 mg/dL (ref 6–20)
CO2: 27 mmol/L (ref 22–32)
Calcium: 8 mg/dL — ABNORMAL LOW (ref 8.9–10.3)
Chloride: 103 mmol/L (ref 98–111)
Creatinine, Ser: 0.59 mg/dL (ref 0.44–1.00)
GFR calc Af Amer: >60 mL/min (ref >60)
GFR calc non Af Amer: >60 mL/min (ref >60)
Glucose, Bld: 97 mg/dL (ref 70–99)
Potassium: 3.8 mmol/L (ref 3.5–5.1)
Sodium: 140 mmol/L (ref 135–145)
Total Bilirubin: 0.6 mg/dL (ref 0.3–1.2)
Total Protein: 5.3 g/dL — ABNORMAL LOW (ref 6.5–8.1)

## 2019-10-03 LAB — FERRITIN: Ferritin: 739 ng/mL — ABNORMAL HIGH (ref 11–307)

## 2019-10-03 LAB — C-REACTIVE PROTEIN: CRP: 1.6 mg/dL — ABNORMAL HIGH (ref <1.0)

## 2019-10-03 LAB — D-DIMER, QUANTITATIVE: D-Dimer, Quant: 0.67 ug/mL-FEU — ABNORMAL HIGH (ref 0.00–0.50)

## 2019-10-03 MED ORDER — FAMOTIDINE 20 MG PO TABS
20.0000 mg | ORAL_TABLET | Freq: Once | ORAL | Status: AC
  Administered 2019-10-03: 20 mg via ORAL
  Filled 2019-10-03: qty 1

## 2019-10-03 MED ORDER — POLYETHYLENE GLYCOL 3350 17 G PO PACK
17.0000 g | PACK | Freq: Every day | ORAL | Status: DC
  Administered 2019-10-03: 17 g via ORAL
  Filled 2019-10-03 (×2): qty 1

## 2019-10-03 NOTE — Progress Notes (Signed)
   Subjective:   No acute events overnight.   Evaluated at bedside this morning. Reports she felt better yesterday, however was disappointed that overnight she felt her cough worsened. Does note that her work of breathing has improved, and her appetite continues to improve. Notably, she had minimal desaturation with movement or extensive conversation today. Shares that yesterday her husband visited the hospital from outside, and she was able to see him from her window.  Objective:  Vital signs in last 24 hours: Vitals:   10/02/19 0516 10/02/19 1440 10/02/19 2148 10/03/19 0500  BP: 114/75 99/68 120/71 109/61  Pulse: (!) 46 (!) 59 (!) 43 (!) 50  Resp: 18 19 20 20   Temp: 98.1 F (36.7 C) 98.2 F (36.8 C) 97.7 F (36.5 C) 98.3 F (36.8 C)  TempSrc: Oral Oral Oral Oral  SpO2: 96% 95% 96% 94%  Weight:      Height:       General: awake, alert, sitting in bedside chair in NAD Pulm: saturating on 5L, minimal to no desaturation with movement or extensive conversation   Assessment/Plan:  Active Problems:   Pneumonia due to COVID-19 virus  Ms. Guallpa is a 49 y/o otherwise healthy female who presents with 2 weeks of malaise, fevers, cough, shortness of breath that has progressed since COVID-19 diagnosis on 09/19/19.  This is hospital day 4.  COVID-19 pneumonia - continues to look better, cough persistent - Minimal to no desaturation today with lengthy conversation or basic movement; currently on 5L [place] - this is day 5 of decadron, remdesivir, and barictinib - inflammatory markers are downtrending; will hopefully be able to wean oxygen in the next day or 2 - supportive care with antipyretics, anti-emetics - Robitussin DM, tussionex for cough  - flutter valve, IS - self-proning as tolerated  - Physical therapy consult  Constipation - patient reports no BM since admission - bowel regimen  Dispo: Anticipated discharge pending further improvement in respiratory status   11/19/19, MD 10/03/2019, 6:32 AM Pager: [phone] After 5pm on weekdays and 1pm on weekends: On Call pager [phone]

## 2019-10-03 NOTE — TOC Initial Note (Signed)
Transition of Care [facility]) - Initial/Assessment Note    Patient Details  Name: Bailey Padilla MRN: [id number] Date of Birth: February 17, 1970  [facility]) CM/SW Contact:    Lockie Pares, RN Phone Number: 10/03/2019, 5:35 PM  Clinical Narrative:                 Hospital day 4 on 5L oxygen still  Increased cough overnight.  Will most likely need oxygen at home. .  Will need oxygen qualifiers in the AM Cont to encourage ambulation up in chair and flutter/IS.  Marland Kitchen   Expected Discharge Plan: Home/Self Care Barriers to Discharge: Continued Medical Work up   Patient Goals and CMS Choice        Expected Discharge Plan and Services Expected Discharge Plan: Home/Self Care       Living arrangements for the past 2 months: Single Family Home                                      Prior Living Arrangements/Services Living arrangements for the past 2 months: Single Family Home Lives with:: Spouse Patient language and need for interpreter reviewed:: Yes        Need for Family Participation in Patient Care: Yes (Comment) Care giver support system in place?: Yes (comment)   Criminal Activity/Legal Involvement Pertinent to Current Situation/Hospitalization: No - Comment as needed  Activities of Daily Living Home Assistive Devices/Equipment: None ADL Screening (condition at time of admission) Patient's cognitive ability adequate to safely complete daily activities?: Yes Is the patient deaf or have difficulty hearing?: No Does the patient have difficulty seeing, even when wearing glasses/contacts?: No Does the patient have difficulty concentrating, remembering, or making decisions?: No Patient able to express need for assistance with ADLs?: Yes Does the patient have difficulty dressing or bathing?: No Independently performs ADLs?: Yes (appropriate for developmental age) Does the patient have difficulty walking or climbing stairs?: No Weakness of Legs: Both Weakness of  Arms/Hands: Both  Permission Sought/Granted Permission sought to share information with : Case Manager                Emotional Assessment       Orientation: : Oriented to Situation, Oriented to  Time, Oriented to Place, Oriented to Self Alcohol / Substance Use: Not Applicable Psych Involvement: No (comment)  Admission diagnosis:  Hypokalemia [E87.6] Acute respiratory failure with hypoxia (HCC) [J96.01] Multifocal pneumonia [J18.9] Pneumonia due to COVID-19 virus [U07.1, J12.82] Patient Active Problem List   Diagnosis Date Noted   Multifocal pneumonia    Pneumonia due to COVID-19 virus 09/29/2019   Acute respiratory failure with hypoxia (HCC)    PCP:  Patient, No Pcp Per Pharmacy:   [id number] - [place], [place] - [address] AT [facility] OF [address] [place] [address] Phone: [phone] Fax: [phone]     Social Determinants of Health (SDOH) Interventions    Readmission Risk Interventions No flowsheet data found.

## 2019-10-04 LAB — CBC WITH DIFFERENTIAL/PLATELET
Abs Immature Granulocytes: 0.27 10*3/uL — ABNORMAL HIGH (ref 0.00–0.07)
Basophils Absolute: 0 10*3/uL (ref 0.0–0.1)
Basophils Relative: 0 %
Eosinophils Absolute: 0 10*3/uL (ref 0.0–0.5)
Eosinophils Relative: 1 %
HCT: 37 % (ref 36.0–46.0)
Hemoglobin: 12.4 g/dL (ref 12.0–15.0)
Immature Granulocytes: 5 %
Lymphocytes Relative: 17 %
Lymphs Abs: 1 10*3/uL (ref 0.7–4.0)
MCH: 29 pg (ref 26.0–34.0)
MCHC: 33.5 g/dL (ref 30.0–36.0)
MCV: 86.4 fL (ref 80.0–100.0)
Monocytes Absolute: 0.5 10*3/uL (ref 0.1–1.0)
Monocytes Relative: 8 %
Neutro Abs: 4.1 10*3/uL (ref 1.7–7.7)
Neutrophils Relative %: 69 %
Platelets: 315 10*3/uL (ref 150–400)
RBC: 4.28 MIL/uL (ref 3.87–5.11)
RDW: 11.7 % (ref 11.5–15.5)
WBC: 5.9 10*3/uL (ref 4.0–10.5)
nRBC: 0 % (ref 0.0–0.2)

## 2019-10-04 LAB — COMPREHENSIVE METABOLIC PANEL
ALT: 48 U/L — ABNORMAL HIGH (ref 0–44)
AST: 29 U/L (ref 15–41)
Albumin: 2.4 g/dL — ABNORMAL LOW (ref 3.5–5.0)
Alkaline Phosphatase: 32 U/L — ABNORMAL LOW (ref 38–126)
Anion gap: 8 (ref 5–15)
BUN: 10 mg/dL (ref 6–20)
CO2: 29 mmol/L (ref 22–32)
Calcium: 8.3 mg/dL — ABNORMAL LOW (ref 8.9–10.3)
Chloride: 103 mmol/L (ref 98–111)
Creatinine, Ser: 0.6 mg/dL (ref 0.44–1.00)
GFR calc Af Amer: >60 mL/min (ref >60)
GFR calc non Af Amer: >60 mL/min (ref >60)
Glucose, Bld: 80 mg/dL (ref 70–99)
Potassium: 3.9 mmol/L (ref 3.5–5.1)
Sodium: 140 mmol/L (ref 135–145)
Total Bilirubin: 0.8 mg/dL (ref 0.3–1.2)
Total Protein: 5.4 g/dL — ABNORMAL LOW (ref 6.5–8.1)

## 2019-10-04 LAB — CULTURE, BLOOD (ROUTINE X 2)
Culture: NO GROWTH
Culture: NO GROWTH
Special Requests: ADEQUATE

## 2019-10-04 LAB — FERRITIN: Ferritin: 719 ng/mL — ABNORMAL HIGH (ref 11–307)

## 2019-10-04 LAB — D-DIMER, QUANTITATIVE: D-Dimer, Quant: 0.78 ug/mL-FEU — ABNORMAL HIGH (ref 0.00–0.50)

## 2019-10-04 LAB — C-REACTIVE PROTEIN: CRP: 2.8 mg/dL — ABNORMAL HIGH (ref <1.0)

## 2019-10-04 MED ORDER — MELATONIN 5 MG PO TABS
5.0000 mg | ORAL_TABLET | Freq: Every day | ORAL | Status: DC
  Administered 2019-10-04: 5 mg via ORAL
  Filled 2019-10-04 (×2): qty 1

## 2019-10-04 MED ORDER — WHITE PETROLATUM EX OINT: TOPICAL_OINTMENT | CUTANEOUS | Status: DC | PRN

## 2019-10-04 NOTE — Progress Notes (Signed)
SATURATION QUALIFICATIONS: (This note is used to comply with regulatory documentation for home oxygen)  Patient Saturations on Room Air at Rest = 91%  Patient Saturations on Room Air while Ambulating = 86%  Patient Saturations on 2 Liters of oxygen while Ambulating = 93%  Please briefly explain why patient needs home oxygen: Patient requires supplemental oxygen to maintain SpO2 >/88% with activity.  Ina Homes, PT, DPT Acute Rehabilitation Services  Pager [phone] Office [phone]

## 2019-10-04 NOTE — Evaluation (Signed)
Physical Therapy Evaluation Patient Details Name: Bailey Padilla MRN: [id number] DOB: [birth year] Today's Date: 10/04/2019   History of Present Illness  Pt is a 49 y.o. female with (+) COVID dx on 09/19/19, now admitted 09/28/19 with progressive malaise, fever, cough and SOB. Workup for COVID-19 PNA. No PMH on file.    Clinical Impression  Pt presents with an overall decrease in functional mobility secondary to above. 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 Initiated education re: current conditions, O2 needs, activity recommendations, positioning, therex, energy conservation, and importance of mobility. Today, pt able to ambulate well with intermittent seated rest breaks. Pt required 2L O2 to maintain SpO2 >/88% with activity (see SATURATIONS QUALIFICATIONS note). Intermittent c/o lightheadedness (see BP values below). From a mobility perspective, pt safe to return home. If to remain admitted, will follow acutely to address established goals.  Orthostatic BPs Sitting 108/63  Standing 96/66  Standing after 2 min 90/56  Post-ambulation 101/58  After walking to bathroom 98/60       Follow Up Recommendations No PT follow up    Equipment Recommendations   (home O2)    Recommendations for Other Services       Precautions / Restrictions Precautions Precautions: Fall Restrictions Weight Bearing Restrictions: No      Mobility  Bed Mobility               General bed mobility comments: seated in recliner  Transfers Overall transfer level: Modified independent Equipment used: None Transfers: Sit to/from Stand           General transfer comment: Multiple sit<>stands from recliner and toilet, mod indep with lines  Ambulation/Gait Ambulation/Gait assistance: Supervision Gait Distance (Feet): 200 Feet Assistive device: None Gait Pattern/deviations: Step-through pattern;Decreased stride length Gait velocity: Decreased   General Gait  Details: Slow, guarded gait without DME, pt reaching to bed rail for intermittent support; multiple laps throughout room with various seated rest breaks (tolerated 80' at a time) due to fatigue; SpO2 down to 86% on RA, maintaining 93% on 2L O2 [place]; pt c/o lightheadedness, BP low but stable  Stairs            Wheelchair Mobility    Modified Rankin (Stroke Patients Only)       Balance Overall balance assessment: No apparent balance deficits (not formally assessed)                                           Pertinent Vitals/Pain Pain Assessment: No/denies pain    Home Living Family/patient expects to be discharged to:: Private residence Living Arrangements: Spouse/significant other Available Help at Discharge: Family;Available 24 hours/day Type of Home: House Home Access: Stairs to enter Entrance Stairs-Rails: Right Entrance Stairs-Number of Steps: 7 Home Layout: Two level;Able to live on main level with bedroom/bathroom Home Equipment: None      Prior Function Level of Independence: Independent         Comments: Husband is pastor at [employer], pt is a Education officer, 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     Hand Dominance        Extremity/Trunk Assessment   Upper Extremity Assessment Upper Extremity Assessment: Overall WFL for tasks assessed    Lower Extremity Assessment Lower Extremity Assessment: Overall WFL for tasks assessed       Communication  Communication: No difficulties  Cognition Arousal/Alertness: Awake/alert Behavior During Therapy: WFL for tasks assessed/performed Overall Cognitive Status: Within Functional Limits for tasks assessed                                        General Comments General comments (skin integrity, edema, etc.): Educ re: energy conservation, activity recommendations, therex, O2 needs, pulse ox monitoring    Exercises     Assessment/Plan     PT Assessment Patient needs continued PT services  PT Problem List Decreased activity tolerance;Decreased mobility;Cardiopulmonary status limiting activity       PT Treatment Interventions Gait training;Stair training;Functional mobility training;Therapeutic activities;Therapeutic exercise;Patient/family education    PT Goals (Current goals can be found in the Care Plan section)  Acute Rehab PT Goals Patient Stated Goal: Hopeful to return home without oxygen PT Goal Formulation: With patient Time For Goal Achievement: 10/18/19 Potential to Achieve Goals: Good    Frequency Min 3X/week   Barriers to discharge        Co-evaluation               AM-PAC PT "6 Clicks" Mobility  Outcome Measure Help needed turning from your back to your side while in a flat bed without using bedrails?: None Help needed moving from lying on your back to sitting on the side of a flat bed without using bedrails?: None Help needed moving to and from a bed to a chair (including a wheelchair)?: None Help needed standing up from a chair using your arms (e.g., wheelchair or bedside chair)?: None Help needed to walk in hospital room?: None Help needed climbing 3-5 steps with a railing? : A Little 6 Click Score: 23    End of Session Equipment Utilized During Treatment: Oxygen Activity Tolerance: Patient tolerated treatment well Patient left: in chair;with call bell/phone within reach Nurse Communication: Mobility status PT Visit Diagnosis: Other abnormalities of gait and mobility (R26.89)    Time: 7989-2119 PT Time Calculation (min) (ACUTE ONLY): 29 min   Charges:   PT Evaluation $PT Eval Moderate Complexity: 1 Mod PT Treatments $Therapeutic Exercise: 8-22 mins   Bailey Padilla, PT, DPT Acute Rehabilitation Services  Pager [phone] Office [phone]  Bailey Padilla 10/04/2019, 2:53 PM

## 2019-10-04 NOTE — Progress Notes (Signed)
   Subjective:   No acute events overnight.   This morning, Bailey Padilla states she is doing "much better." Reports she slept well overnight, has noticed her breathing is easier, and she feels more energetic. She is pleased to report that her nurse weaned her down to 3L [place]. Has been eating and drinking better. Still no BM, though did take senna and Miralax yesterday. Endorses passing gas. Looking forward to working with physical therapy. Does complain of dryness in her nasal passages.   Objective:  Vital signs in last 24 hours: Vitals:   10/03/19 1418 10/03/19 1627 10/03/19 2110 10/04/19 0510  BP:  (!) 94/59 110/67 (!) 102/48  Pulse:   (!) 47 (!) 47  Resp:  20 20 19   Temp:  98.2 F (36.8 C) 98.1 F (36.7 C) 98 F (36.7 C)  TempSrc:  Oral Oral Oral  SpO2: 97% 94% 94% 95%  Weight:      Height:       General: awake, alert, sitting in bedside chair in NAD, continues to appear brighter each morning Pulm: saturating >95% on 3L, minimal to no desaturation with movement or extensive conversation   Assessment/Plan:  Active Problems:   Pneumonia due to COVID-19 virus  Bailey Padilla is a 49 y/o otherwise healthy female who presents with 2 weeks of malaise, fevers, cough, shortness of breath that has progressed since COVID-19 diagnosis on 09/19/19.  This is hospital day 5.  COVID-19 pneumonia - continues to look better, weaned to 3L  with minimal to no desaturation today with lengthy conversation or basic movement - s/p 5 days remdesivir - this is day 6 of decadron and barictinib - inflammatory markers are stable, will no longer trend - supportive care with antipyretics, anti-emetics - Robitussin DM, tussionex for cough  - flutter valve, IS - Will humidify air - Physical therapy consult - Ambulate with pulse ox  Constipation - patient reports no BM since admission, though is passing gas - bowel regimen  Dispo: Anticipated discharge pending further improvement in respiratory  status   11/19/19, MD 10/04/2019, 7:15 AM Pager: [phone] After 5pm on weekdays and 1pm on weekends: On Call pager [phone]

## 2019-10-04 NOTE — TOC Transition Note (Signed)
Transition of Care [facility]) - CM/SW Discharge Note   Patient Details  Name: Bailey Padilla MRN: [id number] Date of Birth: May 01, 1970  Transition of Care [facility]) CM/SW Contact:  Lockie Pares, RN Phone Number: 10/04/2019, 2:47 PM   Clinical Narrative:    Patient qualified for oxygen at 2L [employer]  Called to set up. Patient called but no answer at present.  NO further needs identified.   Final next level of care: Home/Self Care Barriers to Discharge: No Barriers Identified   Patient Goals and CMS Choice        Discharge Placement  Home self care.                     Discharge Plan and Services                DME Arranged: Oxygen DME Agency: Other - Comment Date DME Agency Contacted: 10/04/19 Time DME Agency Contacted: 1444 Representative spoke with at DME Agency: Leta Jungling [employer]            Social Determinants of Health (SDOH) Interventions     Readmission Risk Interventions No flowsheet data found.

## 2019-10-05 MED ORDER — DEXAMETHASONE 6 MG PO TABS: 6.0000 mg | ORAL_TABLET | ORAL | 0 refills | Status: AC

## 2019-10-05 NOTE — Progress Notes (Addendum)
   Subjective:   Yesterday, patient was ambulated with pulse ox by physical therapy and found to require supplemental oxygen (2L) to maintain Sp02 >88% with activity.  No acute events overnight. Ms. Lyttle was sitting in bedside chair. Feels so much better. She is off oxygen at rest. She was able to go the bathroom without difficulty. Is looking forward to getting home. We will walk her again today to determine if she needs oxygen going home. Reports she finally had a BM and is feeling much less bloated. Anne Shutter will end 9/2 which is 21 days from her symptom onset. She was encouraged to get COVID-19 vaccine once her quarantine is complete.  She was informed that symptoms can persist for several weeks, but should slowly recover as she gets further out from her acute illness.    Ambulated with pulse ox by nursing this morning. She desats with ambulation to the mid-80s, however maintains >95% on 2L while ambulating.   Objective:  Vital signs in last 24 hours: Vitals:   10/04/19 0700 10/04/19 0856 10/04/19 1433 10/04/19 2117  BP: (!) 100/59  98/60 105/65  Pulse: 92  82 92  Resp: 20 19 19 20   Temp: 98.3 F (36.8 C)  98.1 F (36.7 C) 98.3 F (36.8 C)  TempSrc: Oral  Oral Oral  SpO2: 91%  93% 92%  Weight:      Height:       General: awake, alert, sitting in bedside chair in NAD, continues to appear brighter each morning Pulm: saturating >95% on room air, minimal to no desaturation with movement or extensive conversation   Assessment/Plan:  Active Problems:   Pneumonia due to COVID-19 virus  Ms. Market is a 49 y/o otherwise healthy female who presents with 2 weeks of malaise, fevers, cough, shortness of breath that has progressed since COVID-19 diagnosis on 09/19/19.  This is hospital day 6.Preparing for discharge home today with home oxygen (2L) for use with exertion.  COVID-19 pneumonia - continues to look better, weaned to room air - ambulation with pulse ox repeated today,  will discharge home with 2L [place] for exertion - s/p 5 days remdesivir - this is day 7 of decadron and barictinib; will discharge home to complete 10-day course of decadron - supportive care with antipyretics, anti-emetics - Robitussin DM, tussionex for cough  - flutter valve, IS  Constipation, resolved - patient reports having had a BM yesterday evening   11/19/19, MD 10/05/2019, 6:12 AM Pager: [phone] After 5pm on weekdays and 1pm on weekends: On Call pager [phone]

## 2019-10-05 NOTE — Progress Notes (Signed)
SATURATION QUALIFICATIONS: (This note is used to comply with regulatory documentation for home oxygen)  Patient Saturations on Room Air at Rest = 97%  Patient Saturations on Room Air while Ambulating = 85%  Patient Saturations on 2 Liters of oxygen while Ambulating = 96%  Please briefly explain why patient needs home oxygen:  Patient is de-sating while walking but recovers quickly. Recommend she go home with oxygen.

## 2019-10-10 DIAGNOSIS — U071 COVID-19: Secondary | ICD-10-CM | POA: Diagnosis not present

## 2019-10-21 DIAGNOSIS — D6869 Other thrombophilia: Secondary | ICD-10-CM | POA: Diagnosis not present

## 2019-10-21 DIAGNOSIS — R0609 Other forms of dyspnea: Secondary | ICD-10-CM | POA: Diagnosis not present

## 2019-10-21 DIAGNOSIS — Z8616 Personal history of COVID-19: Secondary | ICD-10-CM | POA: Diagnosis not present

## 2019-10-21 DIAGNOSIS — J9600 Acute respiratory failure, unspecified whether with hypoxia or hypercapnia: Secondary | ICD-10-CM | POA: Diagnosis not present

## 2019-10-22 ENCOUNTER — Other Ambulatory Visit: Payer: Self-pay | Admitting: Internal Medicine

## 2019-10-22 ENCOUNTER — Ambulatory Visit
Admission: RE | Admit: 2019-10-22 | Discharge: 2019-10-22 | Disposition: A | Discharge status: Home / Self Care | Payer: BC Managed Care – PPO | Source: Ambulatory Visit | Attending: Internal Medicine | Admitting: Internal Medicine

## 2019-10-22 DIAGNOSIS — E041 Nontoxic single thyroid nodule: Secondary | ICD-10-CM | POA: Diagnosis not present

## 2019-10-22 DIAGNOSIS — K449 Diaphragmatic hernia without obstruction or gangrene: Secondary | ICD-10-CM | POA: Diagnosis not present

## 2019-10-22 DIAGNOSIS — R069 Unspecified abnormalities of breathing: Secondary | ICD-10-CM

## 2019-10-22 DIAGNOSIS — R000 Tachycardia, unspecified: Secondary | ICD-10-CM | POA: Diagnosis not present

## 2019-10-22 DIAGNOSIS — R0609 Other forms of dyspnea: Secondary | ICD-10-CM | POA: Diagnosis not present

## 2019-10-22 DIAGNOSIS — R0602 Shortness of breath: Secondary | ICD-10-CM | POA: Diagnosis not present

## 2019-10-22 DIAGNOSIS — Z136 Encounter for screening for cardiovascular disorders: Secondary | ICD-10-CM | POA: Diagnosis not present

## 2019-10-22 DIAGNOSIS — Z131 Encounter for screening for diabetes mellitus: Secondary | ICD-10-CM | POA: Diagnosis not present

## 2019-10-22 DIAGNOSIS — R002 Palpitations: Secondary | ICD-10-CM | POA: Diagnosis not present

## 2019-10-22 MED ORDER — IOPAMIDOL (ISOVUE-370) INJECTION 76%
75.0000 mL | Freq: Once | INTRAVENOUS | Status: AC | PRN
  Administered 2019-10-22: 75 mL via INTRAVENOUS

## 2019-10-29 ENCOUNTER — Ambulatory Visit: Payer: BC Managed Care – PPO | Admitting: Nurse Practitioner

## 2019-11-04 ENCOUNTER — Other Ambulatory Visit: Payer: Self-pay | Admitting: Internal Medicine

## 2019-11-04 DIAGNOSIS — R0609 Other forms of dyspnea: Secondary | ICD-10-CM | POA: Diagnosis not present

## 2019-11-04 DIAGNOSIS — K219 Gastro-esophageal reflux disease without esophagitis: Secondary | ICD-10-CM | POA: Diagnosis not present

## 2019-11-04 DIAGNOSIS — D72819 Decreased white blood cell count, unspecified: Secondary | ICD-10-CM | POA: Diagnosis not present

## 2019-11-04 DIAGNOSIS — Z1239 Encounter for other screening for malignant neoplasm of breast: Secondary | ICD-10-CM

## 2019-11-04 DIAGNOSIS — E8809 Other disorders of plasma-protein metabolism, not elsewhere classified: Secondary | ICD-10-CM | POA: Diagnosis not present

## 2019-11-27 ENCOUNTER — Other Ambulatory Visit: Payer: Self-pay

## 2019-11-27 ENCOUNTER — Ambulatory Visit
Admission: RE | Admit: 2019-11-27 | Discharge: 2019-11-27 | Disposition: A | Discharge status: Home / Self Care | Payer: BC Managed Care – PPO | Source: Ambulatory Visit | Attending: Internal Medicine | Admitting: Internal Medicine

## 2019-11-27 DIAGNOSIS — Z1239 Encounter for other screening for malignant neoplasm of breast: Secondary | ICD-10-CM

## 2019-11-27 DIAGNOSIS — Z1231 Encounter for screening mammogram for malignant neoplasm of breast: Secondary | ICD-10-CM | POA: Diagnosis not present

## 2019-12-03 DIAGNOSIS — Z800 Family history of malignant neoplasm of digestive organs: Secondary | ICD-10-CM | POA: Diagnosis not present

## 2019-12-03 DIAGNOSIS — Z86010 Personal history of colonic polyps: Secondary | ICD-10-CM | POA: Diagnosis not present

## 2019-12-03 DIAGNOSIS — Z1211 Encounter for screening for malignant neoplasm of colon: Secondary | ICD-10-CM | POA: Diagnosis not present

## 2019-12-19 DIAGNOSIS — E8809 Other disorders of plasma-protein metabolism, not elsewhere classified: Secondary | ICD-10-CM | POA: Diagnosis not present

## 2019-12-19 DIAGNOSIS — D72819 Decreased white blood cell count, unspecified: Secondary | ICD-10-CM | POA: Diagnosis not present

## 2019-12-23 DIAGNOSIS — Z1211 Encounter for screening for malignant neoplasm of colon: Secondary | ICD-10-CM | POA: Diagnosis not present

## 2019-12-23 DIAGNOSIS — Z800 Family history of malignant neoplasm of digestive organs: Secondary | ICD-10-CM | POA: Diagnosis not present

## 2020-03-01 DIAGNOSIS — J019 Acute sinusitis, unspecified: Secondary | ICD-10-CM | POA: Diagnosis not present

## 2021-02-09 DIAGNOSIS — D225 Melanocytic nevi of trunk: Secondary | ICD-10-CM | POA: Diagnosis not present

## 2021-02-09 DIAGNOSIS — D485 Neoplasm of uncertain behavior of skin: Secondary | ICD-10-CM | POA: Diagnosis not present

## 2021-02-09 DIAGNOSIS — L814 Other melanin hyperpigmentation: Secondary | ICD-10-CM | POA: Diagnosis not present

## 2021-02-09 DIAGNOSIS — L565 Disseminated superficial actinic porokeratosis (DSAP): Secondary | ICD-10-CM | POA: Diagnosis not present

## 2021-02-09 DIAGNOSIS — L821 Other seborrheic keratosis: Secondary | ICD-10-CM | POA: Diagnosis not present

## 2021-02-09 DIAGNOSIS — C44722 Squamous cell carcinoma of skin of right lower limb, including hip: Secondary | ICD-10-CM | POA: Diagnosis not present

## 2021-04-12 DIAGNOSIS — D0471 Carcinoma in situ of skin of right lower limb, including hip: Secondary | ICD-10-CM | POA: Diagnosis not present

## 2021-10-07 DIAGNOSIS — L565 Disseminated superficial actinic porokeratosis (DSAP): Secondary | ICD-10-CM | POA: Diagnosis not present

## 2021-10-07 DIAGNOSIS — L821 Other seborrheic keratosis: Secondary | ICD-10-CM | POA: Diagnosis not present

## 2021-10-07 DIAGNOSIS — L814 Other melanin hyperpigmentation: Secondary | ICD-10-CM | POA: Diagnosis not present

## 2021-10-07 DIAGNOSIS — L570 Actinic keratosis: Secondary | ICD-10-CM | POA: Diagnosis not present

## 2021-10-12 DIAGNOSIS — Y929 Unspecified place or not applicable: Secondary | ICD-10-CM | POA: Diagnosis not present

## 2021-10-12 DIAGNOSIS — Z880 Allergy status to penicillin: Secondary | ICD-10-CM | POA: Diagnosis not present

## 2021-10-12 DIAGNOSIS — X58XXXA Exposure to other specified factors, initial encounter: Secondary | ICD-10-CM | POA: Diagnosis not present

## 2021-10-12 DIAGNOSIS — S8392XA Sprain of unspecified site of left knee, initial encounter: Secondary | ICD-10-CM | POA: Diagnosis not present

## 2021-10-12 DIAGNOSIS — M25562 Pain in left knee: Secondary | ICD-10-CM | POA: Diagnosis not present

## 2021-10-12 DIAGNOSIS — S8992XA Unspecified injury of left lower leg, initial encounter: Secondary | ICD-10-CM | POA: Diagnosis not present

## 2021-10-20 DIAGNOSIS — F40240 Claustrophobia: Secondary | ICD-10-CM | POA: Diagnosis not present

## 2021-10-20 DIAGNOSIS — S86812A Strain of other muscle(s) and tendon(s) at lower leg level, left leg, initial encounter: Secondary | ICD-10-CM | POA: Diagnosis not present

## 2021-11-18 IMAGING — CT CT ANGIO CHEST
1 of 2 series · 19 of 32 positions shown · IV contrast (APPLIED)
Comparison: None

CLINICAL DATA: Pneumonia, 3C7R2-V4 positive 09/17/2019, tachycardia
and shortness of breath for 2 weeks, question pulmonary embolism

EXAM:
CT ANGIOGRAPHY CHEST WITH CONTRAST
TECHNIQUE: Multidetector CT imaging of the chest was performed using the
standard protocol during bolus administration of intravenous
contrast. Multiplanar CT image reconstructions and MIPs were
obtained to evaluate the vascular anatomy.
CONTRAST:  75mL ZRLMYI-EIM IOPAMIDOL (ZRLMYI-EIM) INJECTION 76% IV

[Series 5: thins 1.0 b31s · axial · 0.66mm/px · z∈[-255,+9]mm · 19 of 294 slices shown]
[im 15/294  lung]
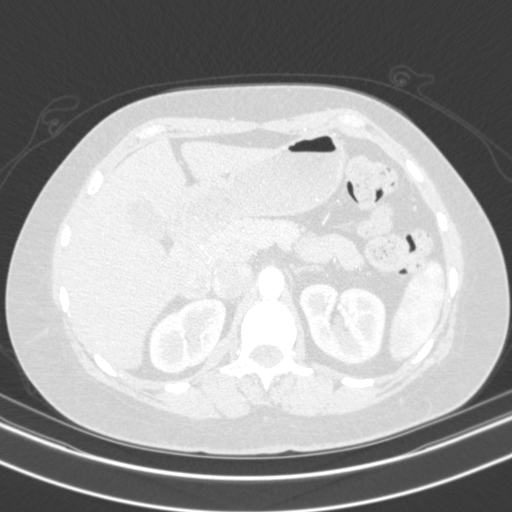
[im 30/294  mediastinal]
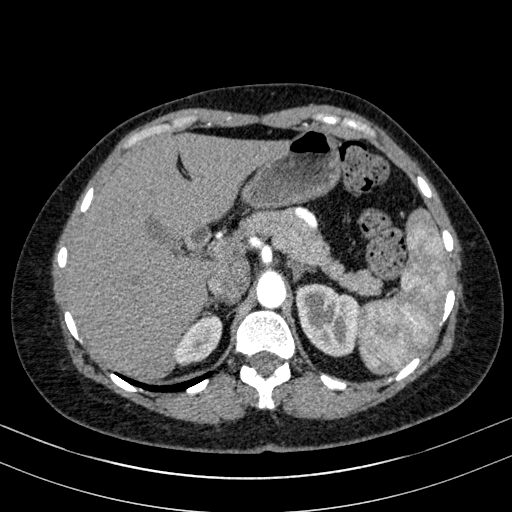
[im 44/294  lung]
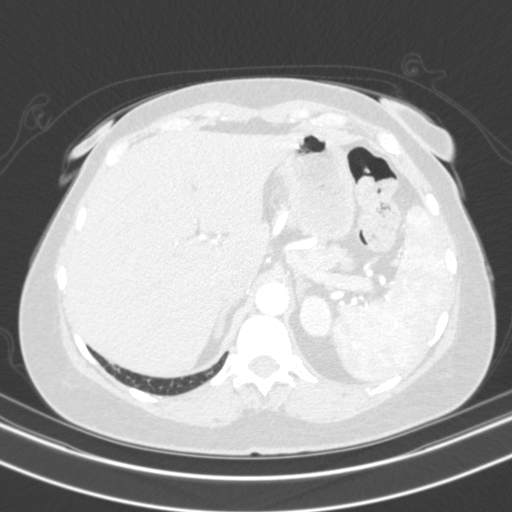
[im 74/294  mediastinal]
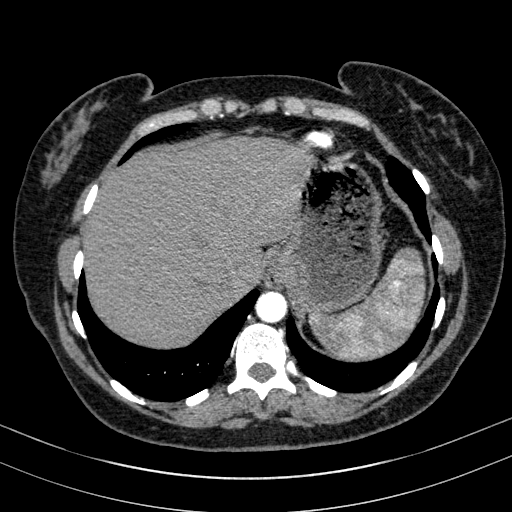
[im 88/294  lung]
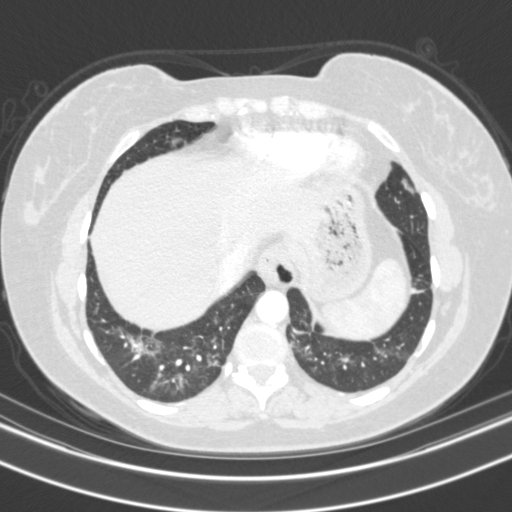
[im 98/294  mediastinal]
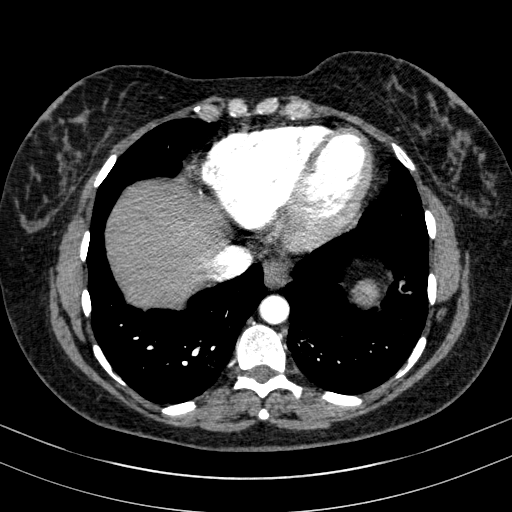
[im 103/294  lung]
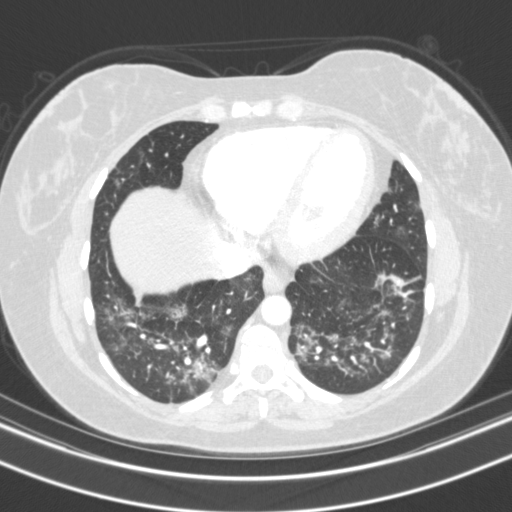
[im 118/294  mediastinal]
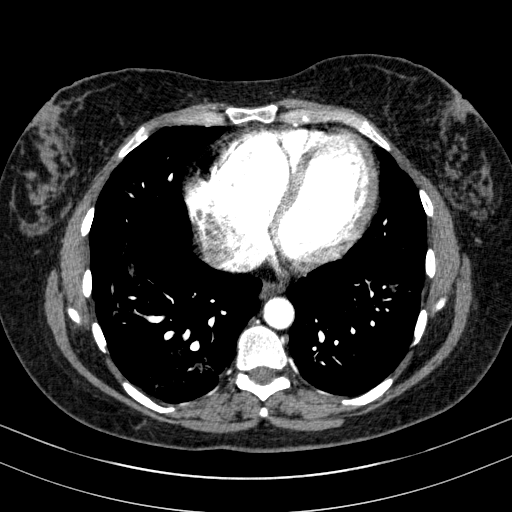
[im 132/294  lung]
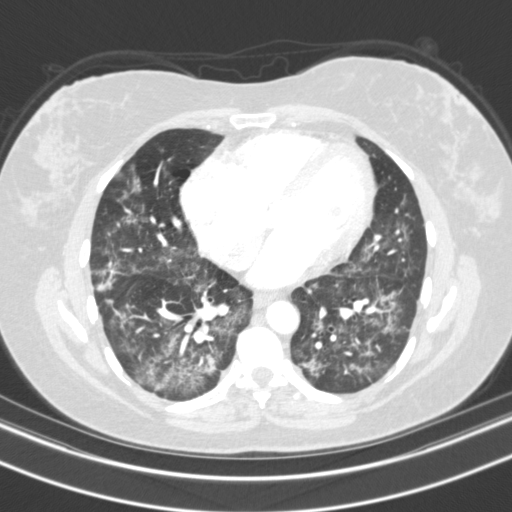
[im 147/294  mediastinal]
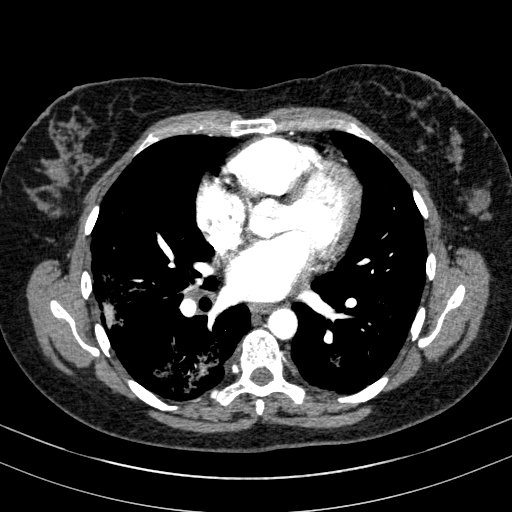
[im 162/294  lung]
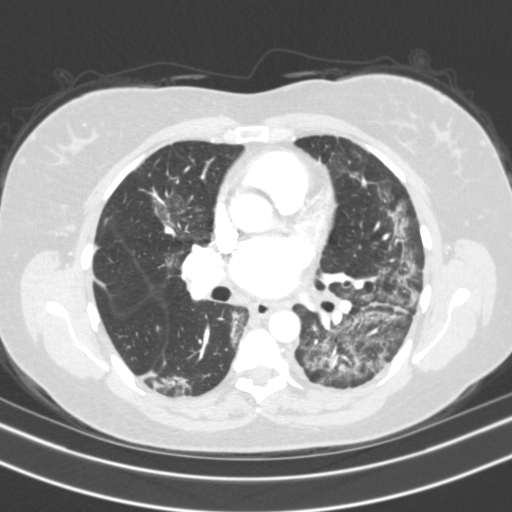
[im 176/294  mediastinal]
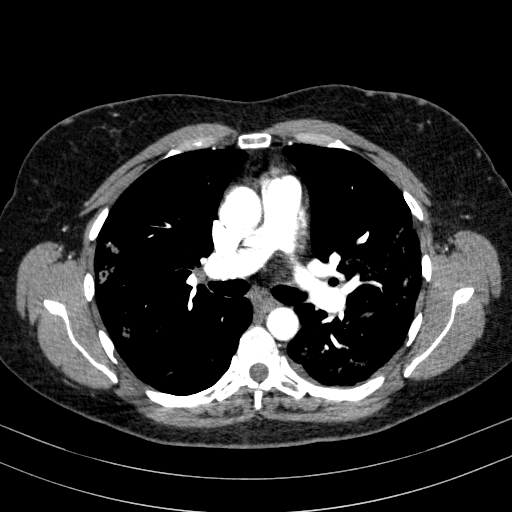
[im 191/294  lung]
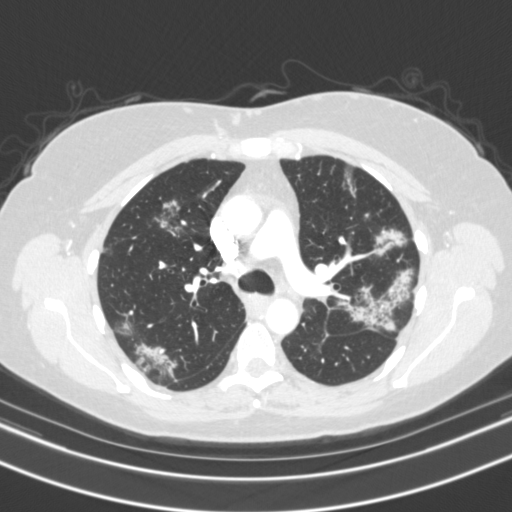
[im 196/294  mediastinal]
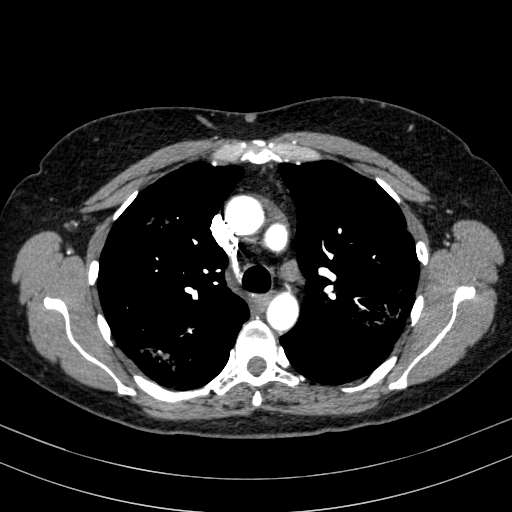
[im 206/294  lung]
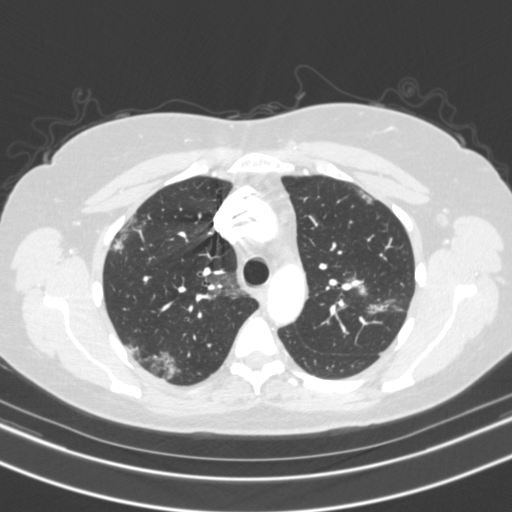
[im 220/294  mediastinal]
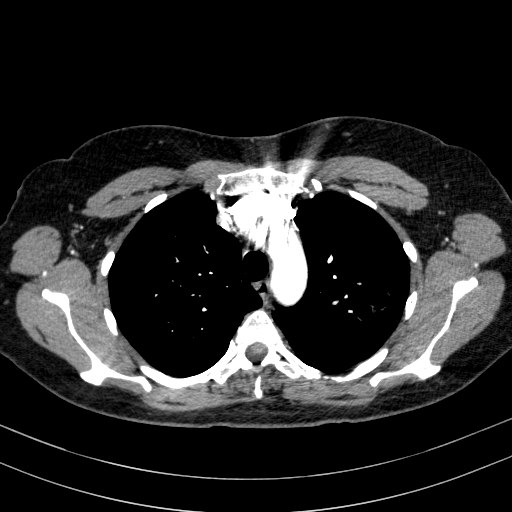
[im 250/294  lung]
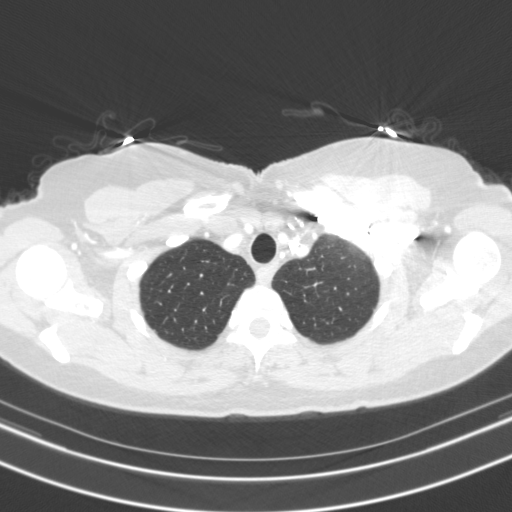
[im 264/294  mediastinal]
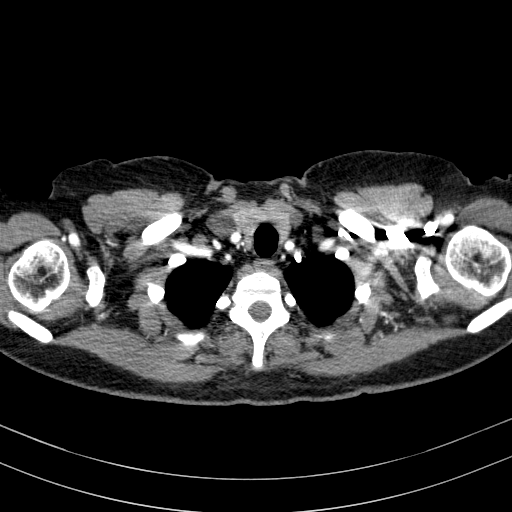
[im 279/294  lung]
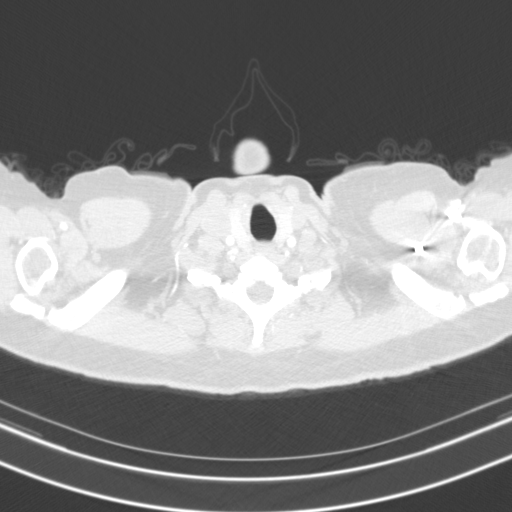

[19 of 32 positions shown; findings below may reference images not displayed]

FINDINGS: Cardiovascular: Aorta normal caliber without aneurysm or dissection.
Heart unremarkable. No pericardial effusion. Pulmonary arteries
adequately opacified and patent. No evidence pulmonary embolism.

Mediastinum/Nodes: 7 mm nodule LEFT thyroid lobe; Not clinically
significant; no follow-up imaging recommended (ref: [HOSPITAL]. [DATE]): 143-50).Small hiatal hernia. Esophagus
unremarkable. No thoracic adenopathy.

Lungs/Pleura: Patchy BILATERAL airspace infiltrates consistent with
multifocal pneumonia and 3C7R2-V4. No pleural effusion or
pneumothorax. No definite mass.

Upper Abdomen: Visualized upper abdomen unremarkable

Musculoskeletal: No acute osseous findings.

Review of the MIP images confirms the above findings.
IMPRESSION: No evidence of pulmonary embolism.

Patchy BILATERAL airspace infiltrates consistent with multifocal
pneumonia and 3C7R2-V4.

Small hiatal hernia.
